# Patient Record
Sex: Female | Born: 2000 | Race: White | Hispanic: No | Marital: Single | Smoking: Never smoker
Health system: Southern US, Community
[De-identification: ages and names within clinical notes are randomized; demographics above are authoritative.]

## PROBLEM LIST (undated history)

## (undated) ENCOUNTER — Ambulatory Visit: Payer: Medicaid Other

## (undated) DIAGNOSIS — B009 Herpesviral infection, unspecified: Secondary | ICD-10-CM

## (undated) DIAGNOSIS — G40909 Epilepsy, unspecified, not intractable, without status epilepticus: Secondary | ICD-10-CM

## (undated) DIAGNOSIS — J45909 Unspecified asthma, uncomplicated: Secondary | ICD-10-CM

## (undated) DIAGNOSIS — F419 Anxiety disorder, unspecified: Secondary | ICD-10-CM

## (undated) DIAGNOSIS — T7840XA Allergy, unspecified, initial encounter: Secondary | ICD-10-CM

## (undated) DIAGNOSIS — R569 Unspecified convulsions: Secondary | ICD-10-CM

## (undated) HISTORY — PX: TYMPANOSTOMY TUBE PLACEMENT: SHX32

## (undated) HISTORY — PX: ADENOIDECTOMY: SUR15

## (undated) HISTORY — PX: FINGER FRACTURE SURGERY: SHX638

## (undated) HISTORY — DX: Epilepsy, unspecified, not intractable, without status epilepticus: G40.909

## (undated) HISTORY — DX: Allergy, unspecified, initial encounter: T78.40XA

## (undated) HISTORY — DX: Anxiety disorder, unspecified: F41.9

## (undated) HISTORY — DX: Herpesviral infection, unspecified: B00.9

## (undated) HISTORY — PX: BRAIN SURGERY: SHX531

---
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
## 2021-12-07 ENCOUNTER — Ambulatory Visit (LOCAL_COMMUNITY_HEALTH_CENTER): Payer: Self-pay | Admitting: Family Medicine

## 2021-12-07 ENCOUNTER — Encounter: Payer: Self-pay | Admitting: Family Medicine

## 2021-12-07 VITALS — BP 128/83 | Ht 65.5 in | Wt 162.4 lb

## 2021-12-07 DIAGNOSIS — Z30013 Encounter for initial prescription of injectable contraceptive: Secondary | ICD-10-CM

## 2021-12-07 DIAGNOSIS — Z8669 Personal history of other diseases of the nervous system and sense organs: Secondary | ICD-10-CM

## 2021-12-07 DIAGNOSIS — Z113 Encounter for screening for infections with a predominantly sexual mode of transmission: Secondary | ICD-10-CM

## 2021-12-07 DIAGNOSIS — Z0000 Encounter for general adult medical examination without abnormal findings: Secondary | ICD-10-CM

## 2021-12-07 DIAGNOSIS — Z3009 Encounter for other general counseling and advice on contraception: Secondary | ICD-10-CM

## 2021-12-07 LAB — HM HIV SCREENING LAB: HM HIV Screening: NEGATIVE

## 2021-12-07 LAB — WET PREP FOR TRICH, YEAST, CLUE
Trichomonas Exam: NEGATIVE
Yeast Exam: NEGATIVE

## 2021-12-07 MED ORDER — MEDROXYPROGESTERONE ACETATE 150 MG/ML IM SUSP
150.0000 mg | INTRAMUSCULAR | Status: AC
  Administered 2021-12-07 – 2022-09-01 (×4): 150 mg via INTRAMUSCULAR

## 2021-12-07 NOTE — Progress Notes (Signed)
[facility] DEPARTMENT ?[facility] ?[address] [employer] ?Main Number: [phone] ? ? ? ?Family Planning Visit- Initial Visit ? ?Subjective:  ?Jill Woodard is a 21 y.o.  G0P0000   being seen today for an initial annual visit and to discuss reproductive life planning.  The patient is currently using Hormonal Injection for pregnancy prevention. Patient reports   does not want a pregnancy in the next year.   ? ? report they are looking for a method that provides High efficacy at preventing pregnancy, Discrete method, Long term method, and Other method that can be used with medical condition.  ? ?Patient has the following medical conditions does not have a problem list on file. ? ?Chief Complaint  ?Patient presents with  ? Annual Exam  ?  PE and Depo  ? ? ?Patient reports here for physical and continue Depo.  Pt moved from [place] and was seen briefly in [place] before moving here.  ? ?Patient denies any other concerns than those listed on intake form.  See intake form.   ? ?Body mass index is 26.61 kg/m?. - Patient is eligible for diabetes screening based on BMI and age >42?  not applicable ?HA1C ordered? not applicable ? ?Patient reports 1  partner/s in last year. Desires STI screening?  Yes ? ?Has patient been screened once for HCV in the past?  No ? No results found for: HCVAB ? ?Does the patient have current drug use (including MJ), have a partner with drug use, and/or has been incarcerated since last result? No  ?If yes-- Screen for HCV through [facility] ?  ?Does the patient meet criteria for HBV testing? No ? ?Criteria:  ?-Household, sexual or needle sharing contact with HBV ?-History of drug use ?-HIV positive ?-Those with known Hep C ? ? ?Health Maintenance Due  ?Topic Date Due  ? HIV Screening  Never done  ? COVID-19 Vaccine (3 - Booster for Pfizer series) 01/10/2020  ? Hepatitis C Screening  Never done  ? ? ?Review of Systems  ?Constitutional:  Negative for chills, fever,  malaise/fatigue and weight loss.  ?HENT:  Negative for congestion, hearing loss and sore throat.   ?Eyes:  Negative for blurred vision, double vision and photophobia.  ?Respiratory:  Negative for shortness of breath.   ?Cardiovascular:  Negative for chest pain.  ?Gastrointestinal:  Negative for abdominal pain, blood in stool, constipation, diarrhea, heartburn, nausea and vomiting.  ?Genitourinary:  Negative for dysuria and frequency.  ?Musculoskeletal:  Negative for back pain, joint pain and neck pain.  ?Skin:  Negative for itching and rash.  ?Neurological:  Positive for dizziness, seizures and headaches. Negative for weakness.  ?Endo/Heme/Allergies:  Does not bruise/bleed easily.  ?Psychiatric/Behavioral:  Negative for depression, substance abuse and suicidal ideas.   ? ?The following portions of the patient's history were reviewed and updated as appropriate: allergies, current medications, past family history, past medical history, past social history, past surgical history and problem list. Problem list updated. ? ? ?See flowsheet for other program required questions. ? ?Objective:  ? ?Vitals:  ? 12/07/21 1022 12/07/21 1156  ?BP:  128/83  ?Weight: 162 lb 6.4 oz (73.7 kg)   ?Height: 5' 5.5" (1.664 m)   ? ? ?Physical Exam ?Vitals and nursing note reviewed.  ?Constitutional:   ?   Appearance: Normal appearance.  ?HENT:  ?   Head: Normocephalic and atraumatic.  ?   Mouth/Throat:  ?   Mouth: Mucous membranes are moist.  ?  Dentition: Abnormal dentition.  ?   Pharynx: No oropharyngeal exudate or posterior oropharyngeal erythema.  ?   Comments: Abnormal tooth decay r/t medication Keppra ?Eyes:  ?   General: No scleral icterus. ?Cardiovascular:  ?   Rate and Rhythm: Normal rate and regular rhythm.  ?   Pulses: Normal pulses.  ?   Heart sounds: Normal heart sounds.  ?Pulmonary:  ?   Effort: Pulmonary effort is normal.  ?   Breath sounds: Normal breath sounds.  ?Chest:  ? ? ?   Comments: Vagal Nerve Stimulator For  Epilepsy  ?Abdominal:  ?   General: Abdomen is flat. Bowel sounds are normal.  ?   Palpations: Abdomen is soft.  ?Musculoskeletal:     ?   General: Normal range of motion.  ?   Cervical back: Normal range of motion and neck supple.  ?Skin: ?   General: Skin is warm and dry.  ?Neurological:  ?   General: No focal deficit present.  ?   Mental Status: She is alert.  ? ? ? ?Assessment and Plan:  ?Jill Woodard is a 21 y.o. female presenting to the [facility] Department for an initial annual wellness/contraceptive visit ? ?Contraception counseling: Reviewed options based on patient desire and reproductive life plan. Patient is interested in Hormonal Injection. This was provided to the patient today.  ? ?Risks, benefits, and typical effectiveness rates were reviewed.  Questions were answered.  Written information was also given to the patient to review.   ? ?The patient will follow up in  13 weeks for surveillance.  The patient was told to call with any further questions, or with any concerns about this method of contraception.  Emphasized use of condoms 100% of the time for STI prevention. ? ?Need for ECP was assessed. Patient reported last sex was 03/2021. ECP not offered.    ? ? ?1. Routine general medical examination at a health care facility ?Well woman exam  ?Discussed when CBE and PAP due  ?Discussed dental care d/t seizure medication effects on teeth. Given information for [facility] dental clinic.  ? ?2. Screening examination for venereal disease ?Patient accepted all screenings including wet prep, oral, vaginal CT/GC and bloodwork for HIV/RPR.  ? ?Wet prep results neg    ?Treatment needed  ?Discussed time line for State Lab results and that patient will be called with positive results and encouraged patient to call if she had not heard in 2 weeks.  ? ?Recommended condom use with all sex ? ? ?- Chlamydia/Gonorrhea [facility] ?- HIV [place] [facility] ?- Syphilis Serology, [place] [facility] ?- WET PREP FOR TRICH,  YEAST, CLUE ? ?3. Encounter for initial prescription of injectable contraceptive ?Pt desires to continue with Depo.  Last Depo was 09/03/2021 @ [facility] in [place], [place].  ?ROI sent for previous records.  ?- medroxyPROGESTERone (DEPO-PROVERA) injection 150 mg ? ?4. History of epilepsy ?Dx at 20-44 years old  ?Last seizure was 5 days ago.   ?Medication managed.  ?Discussed with patient about finding PCP and neurologist in this area. [facility] given.  ?Vagal Nerve Stimulator For Epilepsy palpated  ? ? ?5. Hx of migraines ?Pt has HA with seizures. Lat HA was 5 days ago .    ? ? ? ? ?Return in about 13 weeks (around 03/08/2022) for depo. ? ?No future appointments. ? ?Wendi Snipes, FNP ? ?

## 2021-12-07 NOTE — Progress Notes (Signed)
Pt here for establishing care, DEPO and STD testing. Wet Prep negative. Depo appointment reminder card given. Condoms declined. Lethea Killings RN ?

## 2021-12-10 ENCOUNTER — Ambulatory Visit: Payer: Medicaid - Out of State | Admitting: Nurse Practitioner

## 2022-03-14 ENCOUNTER — Ambulatory Visit (LOCAL_COMMUNITY_HEALTH_CENTER): Payer: Self-pay

## 2022-03-14 VITALS — BP 120/78 | Ht 65.0 in | Wt 162.5 lb

## 2022-03-14 DIAGNOSIS — Z3042 Encounter for surveillance of injectable contraceptive: Secondary | ICD-10-CM

## 2022-03-14 DIAGNOSIS — Z30013 Encounter for initial prescription of injectable contraceptive: Secondary | ICD-10-CM

## 2022-03-14 DIAGNOSIS — Z3009 Encounter for other general counseling and advice on contraception: Secondary | ICD-10-CM

## 2022-03-14 NOTE — Progress Notes (Signed)
Patient seen in nurse clinic.  13 weeks and 6 days post Depo.  Voices no concerns.  Medroxyprogesterone 150 mg administered intramuscularly in right deltoid per order dated 12/07/2021 x 1 year by Elveria Rising, FNP.   Tolerated well. Next injection due 05/30/22 and reminder card given to patient.

## 2022-03-24 ENCOUNTER — Telehealth: Payer: Medicaid - Out of State | Admitting: Physician Assistant

## 2022-03-24 DIAGNOSIS — R61 Generalized hyperhidrosis: Secondary | ICD-10-CM

## 2022-03-24 DIAGNOSIS — J039 Acute tonsillitis, unspecified: Secondary | ICD-10-CM

## 2022-03-24 NOTE — Progress Notes (Signed)
Because of repetitive occurrence of these symptoms over the past few years and associated chills and night sweats, I feel your condition warrants further evaluation and I recommend that you be seen in a face to face visit.   NOTE: There will be NO CHARGE for this eVisit   If you are having a true medical emergency please call 911.      For an urgent face to face visit,  has seven urgent care centers for your convenience:     [facility] Health Urgent Care Center at [facility] Directions [phone] [address] [place], [place] [address]    [facility] Health Urgent Care Center [facility]) Get Driving Directions [phone] [address] [place], [place] [address]  [facility] Health Urgent Care Center [facility]) Get Driving Directions [phone] [address] [place],  [place]  [address]  [facility] Health Urgent Care Center [facility] - at [facility] Directions  [phone] [address] [place],  [place] [address]   [facility] Health Urgent Care at [facility] Get Driving Directions [phone] [address], [address] [place], [place] [address]   [facility] Health Urgent Care at [facility] Get Driving Directions  [phone] [address] [place], [place] [address]   [facility] Health Urgent Care at [facility] Directions [phone] [address] [place], [place] [address]  Your MyChart E-visit questionnaire answers were reviewed by a board certified advanced clinical practitioner to complete your personal care plan based on your specific symptoms.  Thank you for using e-Visits.

## 2022-03-26 ENCOUNTER — Ambulatory Visit
Admission: RE | Admit: 2022-03-26 | Discharge: 2022-03-26 | Disposition: A | Discharge status: Home / Self Care | Payer: Medicaid - Out of State | Source: Ambulatory Visit | Attending: Family Medicine | Admitting: Family Medicine

## 2022-03-26 VITALS — BP 129/95 | HR 97 | Temp 98.4°F | Resp 14 | Ht 65.0 in | Wt 162.0 lb

## 2022-03-26 DIAGNOSIS — J029 Acute pharyngitis, unspecified: Secondary | ICD-10-CM

## 2022-03-26 LAB — GROUP A STREP BY PCR: Group A Strep by PCR: NOT DETECTED

## 2022-03-26 MED ORDER — NYSTATIN 100000 UNIT/ML MT SUSP: 5.0000 mL | Freq: Three times a day (TID) | OROMUCOSAL | 0 refills | Status: DC | PRN

## 2022-03-26 NOTE — Discharge Instructions (Addendum)
I sent by the pharmacy to pick up your prescription.  Be sure to gargle with warm salt water several times a day.  Avoid things that irritate your throat like citrus and soda.  I recommend picking up some Cepacol lozenges or Chloraseptic spray while you are at the pharmacy.

## 2022-03-26 NOTE — ED Provider Notes (Addendum)
[facility]    CSN: [id number] Arrival date & time: 03/26/22  0941      History   Chief Complaint Chief Complaint  Patient presents with   Sore Throat    Appointment    HPI Jill Woodard is a 21 y.o. female.   HPI  Jill Woodard presents for sore throat since Wednesday.  Mom reports that she is having intermittent sore throat for the past 3 years.  She has tested negative for strep several times.  Patient reports it looks like a canker sore that starts off as little red dots and then spreads into the little sores.  Pain started on the right side on Wednesday night and then is now more on the left.  Left side hurts more than the right.  She has been tested for mono which was negative.  He has been eating macaroni and applesauce because it hurts to swallow.  She has been drinking cold drinks and warm drinks as they feel better going down.  Denies fever, abdominal pain, nausea, diarrhea, vomiting, rash, ear pain.  She did experience some chills and headache that are improving.   They have moved from [place]. She has a vagus nerve stimulator due to history of epilepsy.  No recent seizure.     Past Medical History:  Diagnosis Date   Asthma    Seizures [facility])     Patient Active Problem List   Diagnosis Date Noted   Hx of migraines 12/07/2021    Past Surgical History:  Procedure Laterality Date   ADENOIDECTOMY     FINGER FRACTURE SURGERY     TYMPANOSTOMY TUBE PLACEMENT      OB History     Gravida  0   Para  0   Term  0   Preterm  0   AB  0   Living  0      SAB  0   IAB  0   Ectopic  0   Multiple  0   Live Births  0            Home Medications    Prior to Admission medications   Medication Sig Start Date End Date Taking? Authorizing Provider  cloBAZam (ONFI PO) Take 15 mg by mouth 2 (two) times daily.   Yes [provider]  levETIRAcetam (KEPPRA) 500 MG tablet Take 1,000 mg by mouth 2 (two) times daily. 05/17/21  Yes  [provider]  magic mouthwash (nystatin, lidocaine, diphenhydrAMINE, alum & mag hydroxide) suspension Swish and swallow 5 mLs 3 (three) times daily as needed for mouth pain. 03/26/22  Yes Antwoin Lackey, Seward Meth, DO  medroxyPROGESTERone (DEPO-PROVERA) 150 MG/ML injection Inject into the muscle. 02/20/17 08/10/22 Yes [provider]  cephALEXin (KEFLEX) 500 MG capsule Take 1 capsule (500 mg total) by mouth 2 (two) times daily. Patient not taking: Reported on 12/07/2021 02/27/15   Loleta Rose, MD  diazepam (DIASTAT ACUDIAL) 10 MG GEL Place 10 mg rectally once. 02/27/15   Loleta Rose, MD    Family History Family History  Problem Relation Age of Onset   Depression Sister     Social History Social History   Tobacco Use   Smoking status: Never    Passive exposure: Never  Vaping Use   Vaping Use: Never used  Substance Use Topics   Alcohol use: No   Drug use: No     Allergies   Amoxicillin   Review of Systems Review of Systems: :negative unless otherwise stated in  HPI.      Physical Exam Triage Vital Signs ED Triage Vitals  Enc Vitals Group     BP      Pulse      Resp      Temp      Temp src      SpO2      Weight      Height      Head Circumference      Peak Flow      Pain Score      Pain Loc      Pain Edu?      Excl. in GC?    No data found.  Updated Vital Signs BP (!) 129/95 (BP Location: Left Arm)   Pulse 97   Temp 98.4 F (36.9 C) (Oral)   Resp 14   Ht 5\' 5"  (1.651 m)   Wt 73.5 kg   SpO2 100%   BMI 26.96 kg/m   Visual Acuity Right Eye Distance:   Left Eye Distance:   Bilateral Distance:    Right Eye Near:   Left Eye Near:    Bilateral Near:     Physical Exam GEN:     alert, non-toxic appearing female in no distress    HENT:  mucus membranes moist, oropharyngeal erythematous ulcerative lesions, no exudate, 1+ tonsillar hypertrophy, moderate no erythema, bilateral TM normal EYES:   pupils equal and reactive, no scleral  injection NECK:  normal ROM, no lymphadenopathy RESP:  no increased work of breathing  CVS:   regular rate Skin:   warm and dry    UC Treatments / Results  Labs (all labs ordered are listed, but only abnormal results are displayed) Labs Reviewed  GROUP A STREP BY PCR  AEROBIC CULTURE W GRAM STAIN (SUPERFICIAL SPECIMEN)    EKG   Radiology No results found.  Procedures Procedures (including critical care time)  Medications Ordered in UC Medications - No data to display  Initial Impression / Assessment and Plan / UC Course  I have reviewed the triage vital signs and the nursing notes.  Pertinent labs & imaging results that were available during my care of the patient were reviewed by me and considered in my medical decision making (see chart for details).       Pharyngitis Patient is a 21 year old female with intermittent pharyngitis with for the past 3 years.  She presents with acute onset pharyngitis for the past 3 days.  She has known canker sores.  Reports she had a throat culture in the past that showed a yeast infection.  Strep test today was negative.  Throat culture sent to the lab.  She is overall well-appearing, well-hydrated and afebrile.  She has some ulcerative lesions concerning for canker sores on exam.  This may be the cause of her pharyngitis.  Recommended Chloraseptic spray/lozenges and warm salt water gargles.  Advised to avoid acidic foods and things known to make her pharyngitis worse.  Sent prescription for Magic mouthwash to the pharmacy.  Discussed MDM, treatment plan and plan for follow-up with patient/parent who agrees with plan.        Final Clinical Impressions(s) / UC Diagnoses   Final diagnoses:  Pharyngitis, unspecified etiology     Discharge Instructions      I sent by the pharmacy to pick up your prescription.  Be sure to gargle with warm salt water several times a day.  Avoid things that irritate your throat like citrus and soda.   I  recommend picking up some Cepacol lozenges or Chloraseptic spray while you are at the pharmacy.     ED Prescriptions     Medication Sig Dispense Auth. Provider   magic mouthwash (nystatin, lidocaine, diphenhydrAMINE, alum & mag hydroxide) suspension Swish and swallow 5 mLs 3 (three) times daily as needed for mouth pain. 180 mL Katha Cabal, DO      PDMP not reviewed this encounter.   Katha Cabal, DO 03/26/22 1043    Katha Cabal, DO 03/26/22 1045

## 2022-03-26 NOTE — ED Triage Notes (Signed)
Patient c/o sore throat and pain when swallowing that started on Wed.  Patient denies fevers.

## 2022-03-28 LAB — AEROBIC CULTURE W GRAM STAIN (SUPERFICIAL SPECIMEN)

## 2022-05-24 DIAGNOSIS — G43001 Migraine without aura, not intractable, with status migrainosus: Secondary | ICD-10-CM | POA: Diagnosis not present

## 2022-05-24 DIAGNOSIS — R569 Unspecified convulsions: Secondary | ICD-10-CM | POA: Diagnosis not present

## 2022-05-24 DIAGNOSIS — Z79899 Other long term (current) drug therapy: Secondary | ICD-10-CM | POA: Diagnosis not present

## 2022-06-03 DIAGNOSIS — R569 Unspecified convulsions: Secondary | ICD-10-CM | POA: Diagnosis not present

## 2022-06-16 ENCOUNTER — Ambulatory Visit (LOCAL_COMMUNITY_HEALTH_CENTER): Payer: Medicaid Other

## 2022-06-16 VITALS — BP 118/69 | Ht 65.0 in | Wt 166.5 lb

## 2022-06-16 DIAGNOSIS — Z30013 Encounter for initial prescription of injectable contraceptive: Secondary | ICD-10-CM

## 2022-06-16 DIAGNOSIS — Z3042 Encounter for surveillance of injectable contraceptive: Secondary | ICD-10-CM

## 2022-06-16 DIAGNOSIS — Z309 Encounter for contraceptive management, unspecified: Secondary | ICD-10-CM

## 2022-06-16 DIAGNOSIS — Z3009 Encounter for other general counseling and advice on contraception: Secondary | ICD-10-CM

## 2022-06-16 NOTE — Progress Notes (Signed)
13 weeks 3 days post depo. Voices no concerns. Depo given today per order by Hilario Quarry, FNP dated 12/07/2021. Tolerated well L delt. Next depo due 09/01/2022, pt aware. Josie Saunders, RN

## 2022-06-22 DIAGNOSIS — H5213 Myopia, bilateral: Secondary | ICD-10-CM | POA: Diagnosis not present

## 2022-06-27 ENCOUNTER — Telehealth: Payer: Medicaid Other | Admitting: Family Medicine

## 2022-06-27 DIAGNOSIS — J029 Acute pharyngitis, unspecified: Secondary | ICD-10-CM

## 2022-06-27 NOTE — Progress Notes (Signed)
Odin  Never responded back

## 2022-06-29 DIAGNOSIS — R569 Unspecified convulsions: Secondary | ICD-10-CM | POA: Diagnosis not present

## 2022-06-29 DIAGNOSIS — G43001 Migraine without aura, not intractable, with status migrainosus: Secondary | ICD-10-CM | POA: Diagnosis not present

## 2022-06-29 DIAGNOSIS — G479 Sleep disorder, unspecified: Secondary | ICD-10-CM | POA: Diagnosis not present

## 2022-06-29 DIAGNOSIS — Z79899 Other long term (current) drug therapy: Secondary | ICD-10-CM | POA: Diagnosis not present

## 2022-06-29 DIAGNOSIS — F419 Anxiety disorder, unspecified: Secondary | ICD-10-CM | POA: Diagnosis not present

## 2022-07-23 DIAGNOSIS — R569 Unspecified convulsions: Secondary | ICD-10-CM | POA: Diagnosis not present

## 2022-08-19 DIAGNOSIS — R569 Unspecified convulsions: Secondary | ICD-10-CM | POA: Diagnosis not present

## 2022-08-20 DIAGNOSIS — R569 Unspecified convulsions: Secondary | ICD-10-CM | POA: Diagnosis not present

## 2022-08-21 DIAGNOSIS — R569 Unspecified convulsions: Secondary | ICD-10-CM | POA: Diagnosis not present

## 2022-09-01 ENCOUNTER — Ambulatory Visit (LOCAL_COMMUNITY_HEALTH_CENTER): Payer: Medicaid Other

## 2022-09-01 VITALS — BP 118/78 | Ht 65.0 in | Wt 163.0 lb

## 2022-09-01 DIAGNOSIS — Z30013 Encounter for initial prescription of injectable contraceptive: Secondary | ICD-10-CM

## 2022-09-01 DIAGNOSIS — Z3042 Encounter for surveillance of injectable contraceptive: Secondary | ICD-10-CM

## 2022-09-01 DIAGNOSIS — Z309 Encounter for contraceptive management, unspecified: Secondary | ICD-10-CM

## 2022-09-01 DIAGNOSIS — Z3009 Encounter for other general counseling and advice on contraception: Secondary | ICD-10-CM

## 2022-09-01 NOTE — Progress Notes (Signed)
11 weeks 0 days post depo. Voices no concerns. Depo given today per order by Hilario Quarry, FNP dated 12/07/2021. Tolerated well R delt. Next depo due 11/17/2022 and annual PE due 12/09/2022. Has reminder. Josie Saunders, RN

## 2022-09-21 DIAGNOSIS — Z79899 Other long term (current) drug therapy: Secondary | ICD-10-CM | POA: Diagnosis not present

## 2022-09-21 DIAGNOSIS — G479 Sleep disorder, unspecified: Secondary | ICD-10-CM | POA: Diagnosis not present

## 2022-09-21 DIAGNOSIS — R569 Unspecified convulsions: Secondary | ICD-10-CM | POA: Diagnosis not present

## 2022-09-21 DIAGNOSIS — G43001 Migraine without aura, not intractable, with status migrainosus: Secondary | ICD-10-CM | POA: Diagnosis not present

## 2022-09-21 DIAGNOSIS — F419 Anxiety disorder, unspecified: Secondary | ICD-10-CM | POA: Diagnosis not present

## 2022-10-19 DIAGNOSIS — R569 Unspecified convulsions: Secondary | ICD-10-CM | POA: Insufficient documentation

## 2022-10-24 ENCOUNTER — Telehealth: Payer: Medicaid Other | Admitting: Nurse Practitioner

## 2022-10-24 DIAGNOSIS — J020 Streptococcal pharyngitis: Secondary | ICD-10-CM

## 2022-10-24 MED ORDER — AZITHROMYCIN 250 MG PO TABS: ORAL_TABLET | ORAL | 0 refills | Status: AC

## 2022-10-24 NOTE — Progress Notes (Signed)
E-Visit for Sore Throat - Strep Symptoms  We are sorry that you are not feeling well.  Here is how we plan to help!  Based on what you have shared with me it is likely that you have strep pharyngitis.  Strep pharyngitis is inflammation and infection in the back of the throat.  This is an infection cause by bacteria and is treated with antibiotics.  I have prescribed Azithromycin 250 mg two tablets today and then one daily for 4 additional days. For throat pain, we recommend over the counter oral pain relief medications such as acetaminophen or aspirin, or anti-inflammatory medications such as ibuprofen or naproxen sodium. Topical treatments such as oral throat lozenges or sprays may be used as needed. Strep infections are not as easily transmitted as other respiratory infections, however we still recommend that you avoid close contact with loved ones, especially the very young and elderly.  Remember to wash your hands thoroughly throughout the day as this is the number one way to prevent the spread of infection and wipe down door knobs and counters with disinfectant.   Home Care: Only take medications as instructed by your medical team. Complete the entire course of an antibiotic. Do not take these medications with alcohol. A steam or ultrasonic humidifier can help congestion.  You can place a towel over your head and breathe in the steam from hot water coming from a faucet. Avoid close contacts especially the very young and the elderly. Cover your mouth when you cough or sneeze. Always remember to wash your hands.  Get Help Right Away If: You develop worsening fever or sinus pain. You develop a severe head ache or visual changes. Your symptoms persist after you have completed your treatment plan.  Make sure you Understand these instructions. Will watch your condition. Will get help right away if you are not doing well or get worse.   Thank you for choosing an e-visit.  Your e-visit  answers were reviewed by a board certified advanced clinical practitioner to complete your personal care plan. Depending upon the condition, your plan could have included both over the counter or prescription medications.  Please review your pharmacy choice. Make sure the pharmacy is open so you can pick up prescription now. If there is a problem, you may contact your provider through MyChart messaging and have the prescription routed to another pharmacy.  Your safety is important to us. If you have drug allergies check your prescription carefully.   For the next 24 hours you can use MyChart to ask questions about today's visit, request a non-urgent call back, or ask for a work or school excuse. You will get an email in the next two days asking about your experience. I hope that your e-visit has been valuable and will speed your recovery.   Meds ordered this encounter  Medications   azithromycin (ZITHROMAX) 250 MG tablet    Sig: Take 2 tablets on day 1, then 1 tablet daily on days 2 through 5    Dispense:  6 tablet    Refill:  0     I spent approximately 5 minutes reviewing the patient's history, current symptoms and coordinating their care today.   

## 2022-10-26 ENCOUNTER — Encounter: Payer: Self-pay | Admitting: Emergency Medicine

## 2022-10-26 ENCOUNTER — Ambulatory Visit
Admission: EM | Admit: 2022-10-26 | Discharge: 2022-10-26 | Disposition: A | Discharge status: Home / Self Care | Payer: Medicaid Other

## 2022-10-26 DIAGNOSIS — K120 Recurrent oral aphthae: Secondary | ICD-10-CM | POA: Diagnosis not present

## 2022-10-26 NOTE — ED Triage Notes (Signed)
Pt presents with a sore in the back of her throat x 3 days.

## 2022-10-26 NOTE — Discharge Instructions (Signed)
Use the magic mouthwash as needed for pain.  Avoid acidic or hard crunchy foods.  You can also use OTC Chloraseptic or Sucrets lozenges to help with pain.  An amino acid called L-Lysine can be helpful at preventing these from occurring. Take 09-2998 mg 3x a day.

## 2022-10-26 NOTE — ED Provider Notes (Signed)
[facility]    CSN: [id number] Arrival date & time: 10/26/22  0801      History   Chief Complaint Chief Complaint  Patient presents with   Sore    HPI Jill Woodard is a 22 y.o. female.   HPI  22 year old female here for evaluation of sores in throat.  The patient has a past medical history that is significant for asthma and seizures presenting for evaluation of 3 days worth of sores in the back of her throat.  She states that she gets these every few months and they are not associate with fever, runny nose, nasal congestion, or ear pain.  Past Medical History:  Diagnosis Date   Asthma    Seizures 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 Authorizing Provider  clonazePAM (KLONOPIN) 0.5 MG tablet Take by mouth. 10/19/22 10/19/23 Yes [provider]  divalproex (DEPAKOTE ER) 500 MG 24 hr tablet TAKE TWO TABLETS TWICE DAILY 04/29/19  Yes [provider]  lamoTRIgine (LAMICTAL) 100 MG tablet Take by mouth. 10/19/22 10/19/23 Yes [provider]  levETIRAcetam (KEPPRA) 500 MG tablet Take 1,000 mg by mouth 2 (two) times daily. 05/17/21  Yes [provider]  medroxyPROGESTERone (DEPO-PROVERA) 150 MG/ML injection Inject into the muscle. 02/20/17 10/26/22 Yes [provider]  NAYZILAM 5 MG/0.1ML SOLN  06/14/22   Yes [provider]  azithromycin (ZITHROMAX) 250 MG tablet Take 2 tablets on day 1, then 1 tablet daily on days 2 through 5 10/24/22 10/29/22  Apolonio Schneiders, FNP  busPIRone (BUSPAR) 15 MG tablet Take 15 mg by mouth 2 (two) times daily.    [provider]  cephALEXin (KEFLEX) 500 MG capsule Take 1 capsule (500 mg total) by mouth 2 (two) times daily. Patient not taking: Reported on 12/07/2021 02/27/15   Hinda Kehr, MD  cloBAZam (ONFI PO) Take 15 mg by mouth 2 (two) times daily.    [provider]  diazepam (DIASTAT ACUDIAL) 10 MG GEL Place 10 mg rectally once. Patient not taking: Reported on 09/01/2022 02/27/15   Hinda Kehr, MD  magic mouthwash (nystatin, lidocaine, diphenhydrAMINE, alum & mag hydroxide) suspension Swish and swallow 5 mLs 3 (three) times daily as needed for mouth pain. 03/26/22   Lyndee Hensen, DO    Family History Family History  Problem Relation Age of Onset   Depression Sister     Social History Social History   Tobacco Use   Smoking status: Never    Passive exposure: Never  Vaping Use   Vaping Use: Never used  Substance Use  Topics   Alcohol use: No   Drug use: No     Allergies   Dust mite extract, Other, Grapefruit extract, and Amoxicillin   Review of Systems Review of Systems  Constitutional:  Negative for fever.  HENT:  Positive for sore throat. Negative for congestion, ear pain and rhinorrhea.        Sores in throat.     Physical Exam Triage Vital Signs ED Triage Vitals [10/26/22 0810]  Enc Vitals Group     BP      Pulse      Resp      Temp      Temp src      SpO2      Weight      Height      Head Circumference      Peak Flow      Pain Score 8     Pain Loc      Pain Edu?      Excl. in [place]?    No data found.  Updated Vital Signs BP 118/76 (BP Location: Right Arm)   Pulse 94   Temp 98.2 F (36.8 C) (Oral)   Resp 18   SpO2 97%   Visual Acuity Right Eye Distance:   Left Eye Distance:   Bilateral  Distance:    Right Eye Near:   Left Eye Near:    Bilateral Near:     Physical Exam Vitals and nursing note reviewed.  Constitutional:      Appearance: Normal appearance. She is not ill-appearing.  HENT:     Head: Normocephalic and atraumatic.     Mouth/Throat:     Mouth: Mucous membranes are moist.     Pharynx: Oropharynx is clear. Posterior oropharyngeal erythema present. No oropharyngeal exudate.     Comments: The patient's soft palate is erythematous and there is a single aphthous ulcer present on the left-hand side. Musculoskeletal:     Cervical back: Normal range of motion and neck supple.  Lymphadenopathy:     Cervical: No cervical adenopathy.  Neurological:     Mental Status: She is alert.      UC Treatments / Results  Labs (all labs ordered are listed, but only abnormal results are displayed) Labs Reviewed - No data to display  EKG   Radiology No results found.  Procedures Procedures (including critical care time)  Medications Ordered in UC Medications - No data to display  Initial Impression / Assessment and Plan / UC Course  I have reviewed the triage vital signs and the nursing notes.  Pertinent labs & imaging results that were available during my care of the patient were reviewed by me and considered in my medical decision making (see chart for details).   Patient is a very pleasant, nontoxic-appearing 22 year old female presenting for evaluation of 3 days worth of a sore in her throat that she states comes every few months.  She was last seen here in August 2023 and had a rapid strep collected at that time which was negative as well as an aerobic throat culture which showed abundant strep G.  On exam today she has a single white ulcer on her soft palate on the left-hand side.  As you can see image above there is an aphthous ulcer present.  I will treat her for aphthous ulcers with Magic mouthwash, which she states she still has from her last visit,  over-the-counter Chloraseptic or Sucrets lozenges, and I have advised her to avoid sharp  or acidic foods for the time being as this could make her symptoms worse.  We also discussed using the amino acid L-lysine and taking 2000 to 3000 mg 3 times a day as a preventative.  Work note provided.  Final Clinical Impressions(s) / UC Diagnoses   Final diagnoses:  Oral aphthous ulcer     Discharge Instructions      Use the magic mouthwash as needed for pain.  Avoid acidic or hard crunchy foods.  You can also use OTC Chloraseptic or Sucrets lozenges to help with pain.  An amino acid called L-Lysine can be helpful at preventing these from occurring. Take 09-2998 mg 3x a day.     ED Prescriptions   None    PDMP not reviewed this encounter.   Margarette Canada, NP 10/26/22 [phone]

## 2022-11-02 ENCOUNTER — Telehealth: Payer: Medicaid Other | Admitting: Physician Assistant

## 2022-11-02 DIAGNOSIS — R3989 Other symptoms and signs involving the genitourinary system: Secondary | ICD-10-CM

## 2022-11-02 NOTE — Progress Notes (Signed)
Because you have never had genital lesions (sores), I feel your condition warrants further evaluation and I recommend that you be seen in a face to face visit.I do apologize since you were just seen in person for a separate issue, but being that it is a first occurrence we like to have this evaluated in person and have testing collected to confirm a diagnosis.    NOTE: There will be NO CHARGE for this eVisit   If you are having a true medical emergency please call 911.      For an urgent face to face visit, [facility] has eight urgent care centers for your convenience:   NEW!! [place] [facility] [place] Get Driving Directions [phone] [address] [place], [place] [facility] [place] Get Driving Directions [id number] [place], [place] [address]   [place] [facility]) Get Driving Directions [id number] [address], [place] [address]  [place] [facility] [place] ([place]) Get Driving Directions [id number] [address] [place],  [place]  [address]  [place] [facility] [place] [facility] at [address] Get Driving Directions  [phone] [place],  [place] [address]   [place] [facility] at [facility] Get Driving Directions [id number] [place], [place], [place] [address]   [place] [facility] at [facility] Get Driving Directions  [id number] [address] [place], [place] [address]   [place] [facility] at [place] Get Driving Directions [id number] [address], [place], [place] [address]  Your MyChart E-visit questionnaire answers were reviewed by a board certified advanced clinical practitioner to complete your personal care plan based on your specific symptoms.  Thank you for using  e-Visits.   I have spent 5 minutes in review of e-visit questionnaire, review and updating patient chart, medical decision making and response to patient.   Mar Daring, PA-C

## 2022-11-04 ENCOUNTER — Ambulatory Visit
Admission: RE | Admit: 2022-11-04 | Discharge: 2022-11-04 | Disposition: A | Discharge status: Home / Self Care | Payer: Medicaid Other | Source: Ambulatory Visit | Attending: Nurse Practitioner | Admitting: Nurse Practitioner

## 2022-11-04 VITALS — BP 129/84 | HR 85 | Temp 97.9°F | Resp 16 | Ht 67.0 in | Wt 170.0 lb

## 2022-11-04 DIAGNOSIS — N949 Unspecified condition associated with female genital organs and menstrual cycle: Secondary | ICD-10-CM | POA: Diagnosis not present

## 2022-11-04 MED ORDER — VALACYCLOVIR HCL 1 G PO TABS: 1000.0000 mg | ORAL_TABLET | Freq: Two times a day (BID) | ORAL | 0 refills | Status: AC

## 2022-11-04 NOTE — ED Provider Notes (Signed)
[facility]    CSN: [id number] Arrival date & time: 11/04/22  1802      History   Chief Complaint Chief Complaint  Patient presents with   Vaginal Problem    Appt    HPI Jill Woodard is a 22 y.o. female Libby Maw for evaluation of genital lesions.  Patient reports 1 week of painful clusters of genital sores.  Denies any vaginal discharge, dysuria.  Denies any STD exposure or history of HSV.  She was seen last week in urgent care for oral aphathous ulcer treatment and is concerned this may be the same thing. She has used vagisil and miconazole otc without improvement.   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 Authorizing Provider  busPIRone (BUSPAR) 15 MG tablet Take 15 mg by mouth 2 (two) times daily.   Yes [provider]  cloBAZam (ONFI PO) Take 15 mg by mouth 2 (two) times daily.   Yes [provider]  clonazePAM (KLONOPIN) 0.5 MG tablet Take by mouth. 10/19/22 10/19/23 Yes [provider]  diazepam (DIASTAT ACUDIAL) 10 MG GEL Place 10 mg rectally once. 02/27/15  Yes Hinda Kehr, MD  divalproex (DEPAKOTE ER) 500 MG 24 hr tablet TAKE TWO TABLETS TWICE DAILY 04/29/19  Yes [provider]  lamoTRIgine (LAMICTAL) 100 MG tablet Take by mouth.  10/19/22 10/19/23 Yes [provider]  levETIRAcetam (KEPPRA) 500 MG tablet Take 1,000 mg by mouth 2 (two) times daily. 05/17/21  Yes [provider]  magic mouthwash (nystatin, lidocaine, diphenhydrAMINE, alum & mag hydroxide) suspension Swish and swallow 5 mLs 3 (three) times daily as needed for mouth pain. 03/26/22  Yes Brimage, Ronnette Juniper, DO  NAYZILAM 5 MG/0.1ML SOLN  06/14/22  Yes [provider]  valACYclovir (VALTREX) 1000 MG tablet Take 1 tablet (1,000 mg total) by mouth 2 (two) times daily for 7 days. 11/04/22 11/11/22 Yes Melynda Ripple, NP  medroxyPROGESTERone (DEPO-PROVERA) 150 MG/ML injection Inject into the muscle. 02/20/17 10/26/22  [provider]    Family History Family History  Problem Relation Age of Onset   Depression Sister     Social History Social History   Tobacco Use   Smoking status: Never    Passive exposure: Never  Vaping Use   Vaping Use: Never used  Substance Use Topics   Alcohol use: No   Drug use: No     Allergies   Dust mite extract, Other, Grapefruit extract, and Amoxicillin   Review of  Systems Review of Systems  Genitourinary:  Positive for vaginal pain.     Physical Exam Triage Vital Signs ED Triage Vitals  Enc Vitals Group     BP 11/04/22 1811 129/84     Pulse Rate 11/04/22 1811 85     Resp 11/04/22 1811 16     Temp 11/04/22 1811 97.9 F (36.6 C)     Temp Source 11/04/22 1811 Oral     SpO2 11/04/22 1811 98 %     Weight 11/04/22 1810 170 lb (77.1 kg)     Height 11/04/22 1810 5\' 7"  (1.702 m)     Head Circumference --      Peak Flow --      Pain Score 11/04/22 1807 7     Pain Loc --      Pain Edu? --      Excl. in [place]? --    No data found.  Updated Vital Signs BP 129/84 (BP Location: Right Arm)   Pulse 85   Temp 97.9 F (36.6 C) (Oral)   Resp 16   Ht 5\' 7"  (1.702 m)   Wt 170 lb (77.1 kg)   SpO2 98%   BMI 26.63 kg/m   Visual Acuity Right Eye Distance:   Left Eye Distance:   Bilateral  Distance:    Right Eye Near:   Left Eye Near:    Bilateral Near:     Physical Exam Vitals and nursing note reviewed. Exam conducted with a chaperone present Juliette Mangle).  Constitutional:      Appearance: Normal appearance.  HENT:     Head: Normocephalic and atraumatic.  Eyes:     Pupils: Pupils are equal, round, and reactive to light.  Cardiovascular:     Rate and Rhythm: Normal rate.  Pulmonary:     Effort: Pulmonary effort is normal.  Genitourinary:    Labia:        Right: Lesion present. No rash.        Comments: 2 clusters of vesicular lesions on the right labia. Skin:    General: Skin is warm and dry.  Neurological:     General: No focal deficit present.     Mental Status: She is alert and oriented to person, place, and time.  Psychiatric:        Mood and Affect: Mood normal.        Behavior: Behavior normal.      UC Treatments / Results  Labs (all labs ordered are listed, but only abnormal results are displayed) Labs Reviewed  HSV CULTURE AND TYPING    EKG   Radiology No results found.  Procedures Procedures (including critical care time)  Medications Ordered in UC Medications - No data to display  Initial Impression / Assessment and Plan / UC Course  I have reviewed the triage vital signs and the nursing notes.  Pertinent labs & imaging results that were available during my care of the patient were reviewed by me and considered in my medical decision making (see chart for details).     Reviewed exam and symptoms with patient.  Discussed exam consistent with genital herpes Start Valtrex twice daily for 7 days Advise she follow-up with her GYN or PCP if her symptoms do not improve and for further treatment for future outbreaks ER precautions reviewed and patient verbalized understanding Final Clinical Impressions(s) / UC Diagnoses   Final diagnoses:  Genital lesion, female     Discharge Instructions      Start Valtrex  twice daily for  7 days Please follow-up with your gynecologist or PCP if your symptoms or not improving Please go to the ER for any worsening symptoms   ED Prescriptions     Medication Sig Dispense Auth. Provider   valACYclovir (VALTREX) 1000 MG tablet Take 1 tablet (1,000 mg total) by mouth 2 (two) times daily for 7 days. 14 tablet Melynda Ripple, NP      PDMP not reviewed this encounter.   Melynda Ripple, NP 11/04/22 [phone]

## 2022-11-04 NOTE — ED Triage Notes (Signed)
Pt c/o vaginal sores around vagina x1 wk states they look like canker sores. Was seen on 3/13 for sores in mouth. Pain worse when urinating has used vagisil & miconazole w/o relief.

## 2022-11-04 NOTE — Discharge Instructions (Addendum)
Start Valtrex twice daily for 7 days Please follow-up with your gynecologist or PCP if your symptoms or not improving Please go to the ER for any worsening symptoms

## 2022-11-06 LAB — HSV CULTURE AND TYPING

## 2022-11-24 ENCOUNTER — Ambulatory Visit: Payer: Medicaid Other

## 2022-11-25 ENCOUNTER — Ambulatory Visit: Payer: Medicaid Other

## 2022-11-25 VITALS — BP 108/76 | Ht 65.0 in | Wt 173.0 lb

## 2022-11-25 DIAGNOSIS — Z3042 Encounter for surveillance of injectable contraceptive: Secondary | ICD-10-CM

## 2022-11-25 DIAGNOSIS — Z30013 Encounter for initial prescription of injectable contraceptive: Secondary | ICD-10-CM

## 2022-11-25 DIAGNOSIS — Z309 Encounter for contraceptive management, unspecified: Secondary | ICD-10-CM | POA: Diagnosis not present

## 2022-11-25 DIAGNOSIS — Z3009 Encounter for other general counseling and advice on contraception: Secondary | ICD-10-CM

## 2022-11-25 MED ORDER — MEDROXYPROGESTERONE ACETATE 150 MG/ML IM SUSP
150.0000 mg | Freq: Once | INTRAMUSCULAR | Status: AC
  Administered 2022-11-25: 150 mg via INTRAMUSCULAR

## 2022-11-25 NOTE — Progress Notes (Signed)
12 weeks 1 day post depo. Voices no concerns. Depo given today per SO Dr Ralene Bathe. Tolerated well L delt. Annual PE due 12/09/2022 and next depo due 02/10/2023. Pt plans to have annual when next depo is due. Has reminder. Jerel Shepherd, RN 3

## 2022-12-12 ENCOUNTER — Encounter: Payer: Self-pay | Admitting: Family Medicine

## 2022-12-19 DIAGNOSIS — G40909 Epilepsy, unspecified, not intractable, without status epilepticus: Secondary | ICD-10-CM | POA: Diagnosis not present

## 2022-12-19 DIAGNOSIS — Z01818 Encounter for other preprocedural examination: Secondary | ICD-10-CM | POA: Diagnosis not present

## 2022-12-19 DIAGNOSIS — R569 Unspecified convulsions: Secondary | ICD-10-CM | POA: Diagnosis not present

## 2022-12-29 ENCOUNTER — Encounter: Payer: Self-pay | Admitting: Family Medicine

## 2022-12-29 ENCOUNTER — Ambulatory Visit (INDEPENDENT_AMBULATORY_CARE_PROVIDER_SITE_OTHER): Payer: Medicaid Other | Admitting: Family Medicine

## 2022-12-29 VITALS — BP 120/68 | HR 106 | Ht 67.0 in | Wt 173.0 lb

## 2022-12-29 DIAGNOSIS — G40909 Epilepsy, unspecified, not intractable, without status epilepticus: Secondary | ICD-10-CM

## 2022-12-29 DIAGNOSIS — F418 Other specified anxiety disorders: Secondary | ICD-10-CM | POA: Diagnosis not present

## 2022-12-29 DIAGNOSIS — M778 Other enthesopathies, not elsewhere classified: Secondary | ICD-10-CM | POA: Diagnosis not present

## 2022-12-29 DIAGNOSIS — R569 Unspecified convulsions: Secondary | ICD-10-CM

## 2023-01-03 ENCOUNTER — Encounter: Payer: Self-pay | Admitting: Family Medicine

## 2023-01-03 DIAGNOSIS — M778 Other enthesopathies, not elsewhere classified: Secondary | ICD-10-CM | POA: Insufficient documentation

## 2023-01-03 DIAGNOSIS — F418 Other specified anxiety disorders: Secondary | ICD-10-CM | POA: Insufficient documentation

## 2023-01-03 HISTORY — DX: Other specified anxiety disorders: F41.8

## 2023-01-03 NOTE — Assessment & Plan Note (Signed)
See additional assessment(s) for plan details. 

## 2023-01-03 NOTE — Progress Notes (Signed)
     Primary Care / Sports Medicine Office Visit  Patient Information:  Patient ID: Jill Woodard, female DOB: [birth year] Age: 22 y.o. MRN: [id number]   Jill Woodard is a pleasant 22 y.o. female presenting with the following:  Chief Complaint  Patient presents with   Establish Care   Wrist Pain    Right wrist, sharp up arm    Vitals:   12/29/22 0830  BP: 120/68  Pulse: (!) 106  SpO2: 97%   Vitals:   12/29/22 0830  Weight: 173 lb (78.5 kg)  Height: 5\' 7"  (1.702 m)   Body mass index is 27.1 kg/m.  No results found.   Independent interpretation of notes and tests performed by another provider:   None  Procedures performed:   None  Pertinent History, Exam, Impression, and Recommendations:   Jill Woodard was seen today for establish care and wrist pain.  Depression with anxiety Assessment & Plan: Reviewed PHQ and GAD scores, management strategies. - Referral to psychiatry for further evaluation and treatment  Orders: -     Ambulatory referral to Psychiatry  Right wrist tendinitis Assessment & Plan: - PRN NSAIDs - Home rehab - Follow-up PRN   Focal seizures (HCC) Assessment & Plan: See additional assessment(s) for plan details.   Seizure disorder [facility]) Assessment & Plan: Follows with neurology.      Orders & Medications No orders of the defined types were placed in this encounter.  Orders Placed This Encounter  Procedures   Ambulatory referral to Psychiatry     No follow-ups on file.     Jerrol Banana, MD, [facility]   Primary Care Sports Medicine Primary Care and Sports Medicine at [facility]

## 2023-01-03 NOTE — Assessment & Plan Note (Signed)
-   PRN NSAIDs - Home rehab - Follow-up PRN

## 2023-01-03 NOTE — Assessment & Plan Note (Signed)
Follows with neurology 

## 2023-01-03 NOTE — Assessment & Plan Note (Signed)
Reviewed PHQ and GAD scores, management strategies. - Referral to psychiatry for further evaluation and treatment

## 2023-01-10 ENCOUNTER — Encounter: Payer: Self-pay | Admitting: Family Medicine

## 2023-01-10 NOTE — Telephone Encounter (Signed)
Please advise 

## 2023-02-01 ENCOUNTER — Encounter: Payer: Self-pay | Admitting: Family Medicine

## 2023-02-02 ENCOUNTER — Other Ambulatory Visit: Payer: Self-pay

## 2023-02-02 DIAGNOSIS — R200 Anesthesia of skin: Secondary | ICD-10-CM

## 2023-02-02 DIAGNOSIS — G8929 Other chronic pain: Secondary | ICD-10-CM

## 2023-02-02 DIAGNOSIS — M778 Other enthesopathies, not elsewhere classified: Secondary | ICD-10-CM

## 2023-02-02 NOTE — Telephone Encounter (Signed)
Order placed

## 2023-02-02 NOTE — Telephone Encounter (Signed)
Please advise 

## 2023-02-08 ENCOUNTER — Ambulatory Visit: Payer: Medicaid Other | Admitting: Family Medicine

## 2023-02-09 ENCOUNTER — Ambulatory Visit: Payer: Medicaid Other | Admitting: Family Medicine

## 2023-02-13 ENCOUNTER — Encounter: Payer: Self-pay | Admitting: Family Medicine

## 2023-02-13 ENCOUNTER — Ambulatory Visit: Payer: Medicaid Other | Admitting: Family Medicine

## 2023-02-13 VITALS — BP 130/88 | HR 113 | Ht 65.0 in | Wt 169.8 lb

## 2023-02-13 DIAGNOSIS — Z30013 Encounter for initial prescription of injectable contraceptive: Secondary | ICD-10-CM

## 2023-02-13 DIAGNOSIS — Z01419 Encounter for gynecological examination (general) (routine) without abnormal findings: Secondary | ICD-10-CM

## 2023-02-13 DIAGNOSIS — Z309 Encounter for contraceptive management, unspecified: Secondary | ICD-10-CM | POA: Diagnosis not present

## 2023-02-13 DIAGNOSIS — Z113 Encounter for screening for infections with a predominantly sexual mode of transmission: Secondary | ICD-10-CM

## 2023-02-13 DIAGNOSIS — Z3009 Encounter for other general counseling and advice on contraception: Secondary | ICD-10-CM

## 2023-02-13 LAB — WET PREP FOR TRICH, YEAST, CLUE
Trichomonas Exam: NEGATIVE
Yeast Exam: NEGATIVE

## 2023-02-13 LAB — HM HIV SCREENING LAB: HM HIV Screening: NEGATIVE

## 2023-02-13 MED ORDER — MEDROXYPROGESTERONE ACETATE 150 MG/ML IM SUSP
150.0000 mg | INTRAMUSCULAR | Status: AC
  Administered 2023-02-13 – 2024-01-12 (×5): 150 mg via INTRAMUSCULAR

## 2023-02-13 NOTE — Progress Notes (Signed)
[facility] DEPARTMENT [facility] [address] Main Number: [phone]  Family Planning Visit- Repeat Yearly Visit  Subjective:  Jill Woodard is a 22 y.o. G0P0000  being seen today for an annual wellness visit and to discuss contraception options.   The patient is currently using Hormonal Injection for pregnancy prevention. Patient does not want a pregnancy in the next year.    report they are looking for a method that provides High efficacy at preventing pregnancy   Patient has the following medical problems: has Hx of migraines; Focal seizures (HCC); Localization-related focal epilepsy with complex partial seizures (HCC); Seizure disorder (HCC); Depression with anxiety; and Right wrist tendinitis on their problem list.  Chief Complaint  Patient presents with   Annual Exam    Depo    Patient reports to clinic for PE and STI testing.   Patient denies concerns about self- states that she has a hx of seizures and weight gain due to her medications. Reports occasional headaches relieved by acupressure    See flowsheet for other program required questions.   Body mass index is 28.26 kg/m. - Patient is eligible for diabetes screening based on BMI> 25 and age >35?  no HA1C ordered? not applicable  Patient reports 1 of partners in last year. Desires STI screening?  Yes   Has patient been screened once for HCV in the past?  No  No results found for: "HCVAB"  Does the patient have current of drug use, have a partner with drug use, and/or has been incarcerated since last result? No  If yes-- Screen for HCV through [facility]   Does the patient meet criteria for HBV testing? No  Criteria:  -Household, sexual or needle sharing contact with HBV -History of drug use -HIV positive -Those with known Hep C   Health Maintenance Due  Topic Date Due   CHLAMYDIA SCREENING  Never done   COVID-19 Vaccine (3 - Pfizer risk series)  12/13/2019   Hepatitis C Screening  Never done    Review of Systems  Constitutional:  Negative for weight loss.  Eyes:  Negative for blurred vision.  Respiratory:  Negative for cough and shortness of breath.   Cardiovascular:  Negative for claudication.  Gastrointestinal:  Negative for nausea.  Genitourinary:  Negative for dysuria and frequency.  Skin:  Negative for rash.  Neurological:  Positive for seizures and headaches.  Endo/Heme/Allergies:  Does not bruise/bleed easily.    The following portions of the patient's history were reviewed and updated as appropriate: allergies, current medications, past family history, past medical history, past social history, past surgical history and problem list. Problem list updated.  Objective:   Vitals:   02/13/23 1015  BP: 130/88  Pulse: (!) 113  Weight: 169 lb 12.8 oz (77 kg)  Height: 5\' 5"  (1.651 m)    Physical Exam Vitals and nursing note reviewed.  Constitutional:      Appearance: Normal appearance.  HENT:     Head: Normocephalic and atraumatic.     Mouth/Throat:     Mouth: Mucous membranes are moist.     Pharynx: Oropharynx is clear. No oropharyngeal exudate or posterior oropharyngeal erythema.  Pulmonary:     Effort: Pulmonary effort is normal.  Abdominal:     General: Abdomen is flat.     Palpations: There is no mass.     Tenderness: There is no abdominal tenderness. There is no rebound.  Genitourinary:    General: Normal  vulva.     Exam position: Lithotomy position.     Pubic Area: No rash or pubic lice.      Labia:        Right: No rash or lesion.        Left: No rash or lesion.      Vagina: Vaginal discharge present. No erythema, bleeding or lesions.     Cervix: Discharge present. No cervical motion tenderness, friability, lesion or erythema.     Uterus: Normal.      Adnexa: Right adnexa normal and left adnexa normal.     Rectum: Normal.     Comments: pH = 5-6  White discharge with odor present   Lymphadenopathy:     Head:     Right side of head: No preauricular or posterior auricular adenopathy.     Left side of head: No preauricular or posterior auricular adenopathy.     Cervical: No cervical adenopathy.     Upper Body:     Right upper body: No supraclavicular, axillary or epitrochlear adenopathy.     Left upper body: No supraclavicular, axillary or epitrochlear adenopathy.     Lower Body: No right inguinal adenopathy. No left inguinal adenopathy.  Skin:    General: Skin is warm and dry.     Findings: No rash.  Neurological:     Mental Status: She is alert and oriented to person, place, and time.       Assessment and Plan:  Jill Woodard is a 22 y.o. female G0P0000 presenting to the [facility] Department for an yearly wellness and contraception visit  1. Well woman exam with routine gynecological exam -Pap due when 22 -CBE due at 25 per ACOG guidelines -no concerns about self   2. Screening for venereal disease  - Chlamydia/Gonorrhea [facility] - HIV [facility] - Syphilis Serology, [place] - WET PREP FOR TRICH, YEAST, CLUE  3. Family planning Contraception counseling: Reviewed options based on patient desire and reproductive life plan. Patient is interested in Hormonal Injection. This was provided to the patient today.   Risks, benefits, and typical effectiveness rates were reviewed.  Questions were answered.  Written information was also given to the patient to review.    The patient will follow up in  3 months for surveillance.  The patient was told to call with any further questions, or with any concerns about this method of contraception.  Emphasized use of condoms 100% of the time for STI prevention.  Educated on ECP and assessed need for ECP. Not indicated- covered under depo window  - medroxyPROGESTERone (DEPO-PROVERA) injection 150 mg    No follow-ups on file.  Future Appointments  Date Time Provider Department Center   02/14/2023 10:40 AM Jerrol Banana, MD [facility]  02/23/2023  8:20 AM Jerrol Banana, MD [facility]    Lenice Llamas, FNP

## 2023-02-13 NOTE — Progress Notes (Signed)
Pt here for annual physical examination and Depo Provera injection.  Wet mount results reviewed with patient.  No treatment needed at this time as per standing orders.  Depo Provera 150mg  IM given in [facility] without complications.  Family planning packet provided.  Condoms declined. Reminder card given for next Depo injection.-Havyn Ramo, RN

## 2023-02-14 ENCOUNTER — Ambulatory Visit
Admission: RE | Admit: 2023-02-14 | Discharge: 2023-02-14 | Disposition: A | Discharge status: Home / Self Care | Payer: Medicaid Other | Source: Ambulatory Visit | Attending: Family Medicine | Admitting: Family Medicine

## 2023-02-14 ENCOUNTER — Encounter: Payer: Self-pay | Admitting: Family Medicine

## 2023-02-14 ENCOUNTER — Ambulatory Visit
Admission: RE | Admit: 2023-02-14 | Discharge: 2023-02-14 | Disposition: A | Discharge status: Home / Self Care | Payer: Medicaid Other | Attending: Family Medicine | Admitting: Family Medicine

## 2023-02-14 ENCOUNTER — Ambulatory Visit (INDEPENDENT_AMBULATORY_CARE_PROVIDER_SITE_OTHER): Payer: Medicaid Other | Admitting: Family Medicine

## 2023-02-14 VITALS — BP 130/80 | HR 96 | Ht 65.0 in | Wt 170.0 lb

## 2023-02-14 DIAGNOSIS — R202 Paresthesia of skin: Secondary | ICD-10-CM | POA: Diagnosis not present

## 2023-02-14 DIAGNOSIS — G8929 Other chronic pain: Secondary | ICD-10-CM | POA: Diagnosis not present

## 2023-02-14 DIAGNOSIS — M25511 Pain in right shoulder: Secondary | ICD-10-CM | POA: Diagnosis not present

## 2023-02-14 DIAGNOSIS — M5412 Radiculopathy, cervical region: Secondary | ICD-10-CM | POA: Diagnosis not present

## 2023-02-14 DIAGNOSIS — M778 Other enthesopathies, not elsewhere classified: Secondary | ICD-10-CM

## 2023-02-14 DIAGNOSIS — M25531 Pain in right wrist: Secondary | ICD-10-CM | POA: Diagnosis not present

## 2023-02-14 DIAGNOSIS — R200 Anesthesia of skin: Secondary | ICD-10-CM | POA: Diagnosis not present

## 2023-02-14 DIAGNOSIS — M79641 Pain in right hand: Secondary | ICD-10-CM | POA: Diagnosis not present

## 2023-02-14 MED ORDER — MELOXICAM 15 MG PO TABS: 15.0000 mg | ORAL_TABLET | Freq: Every day | ORAL | 0 refills | Status: DC | PRN

## 2023-02-14 MED ORDER — CYCLOBENZAPRINE HCL 5 MG PO TABS: 5.0000 mg | ORAL_TABLET | Freq: Three times a day (TID) | ORAL | 1 refills | Status: DC | PRN

## 2023-02-14 NOTE — Progress Notes (Signed)
Primary Care / Sports Medicine Office Visit  Patient Information:  Patient ID: Jill Woodard, female DOB: 2000-11-12 Age: 22 y.o. MRN: [id number]   Jill Woodard is a pleasant 22 y.o. female presenting with the following:  No chief complaint on file.   Vitals:   02/14/23 1045  BP: 130/80  Pulse: 96  SpO2: 99%   Vitals:   02/14/23 1045  Weight: 170 lb (77.1 kg)  Height: 5\' 5"  (1.651 m)   Body mass index is 28.29 kg/m.  No results found.   Independent interpretation of notes and tests performed by another provider:   Independent interpretation of right hand, wrist, and shoulder x-rays appear benign.  Independent interpretation of cervical spine x-rays dated 02/14/2023 demonstrate straightening of the expected cervical lordosis, there is a leftward tilt on AP view, no significant intervertebral space narrowing or degenerative changes otherwise, foramina patent bilaterally, no acute osseous processes identified.  Procedures performed:   None  Pertinent History, Exam, Impression, and Recommendations:   Cervical radicular pain Assessment & Plan: Brings up chronic neck tightness and stiffness, right upper extremity shooting pain, intermittent paresthesias, primarily localizing about the right wrist.  Additional assessment for details.  Examination today with limited range of motion secondary to muscular tightness, sensorimotor intact bilateral upper extremities, positive right-sided Spurling's.  Given her clinical symptoms, radiographic findings, concern for cervical radicular pain secondary to possible intervertebral disc disorder versus by biomechanical issues.  Plan as follows: - Start meloxicam daily x 2 weeks then daily as needed - Can dose muscle relaxer (cyclobenzaprine) up to 3 times a day, side effect can be drowsiness - Start formal physical therapy, referral coroner will contact for scheduling - Return in 2 months - Suboptimal progress to be  addressed with consideration of advanced imaging and referral to interventional spine group  Orders: -     Cyclobenzaprine HCl; Take 1 tablet (5 mg total) by mouth 3 (three) times daily as needed for muscle spasms.  Dispense: 60 tablet; Refill: 1 -     Meloxicam; Take 1 tablet (15 mg total) by mouth daily as needed for pain.  Dispense: 60 tablet; Refill: 0  Right wrist tendinitis Assessment & Plan: Continues to be symptomatic, primarily along the radial distribution, some improvement with brace.  Brings up symptoms going up and down her arm and neck stiffness.  Patient may have some degree of wrist tendinopathy given the response to cock up wrist brace however primary symptoms are stemming from cervical spine. See additional assessment(s) for plan details.  - Can continue wrist brace as needed - Start formal PT - Will be on meloxicam daily - Follow-up in 2 months, suboptimal progress can be addressed with consideration of advanced imaging, local cortisone injections as clinically guided   Orders: -     Meloxicam; Take 1 tablet (15 mg total) by mouth daily as needed for pain.  Dispense: 60 tablet; Refill: 0     Orders & Medications Meds ordered this encounter  Medications   cyclobenzaprine (FLEXERIL) 5 MG tablet    Sig: Take 1 tablet (5 mg total) by mouth 3 (three) times daily as needed for muscle spasms.    Dispense:  60 tablet    Refill:  1   meloxicam (MOBIC) 15 MG tablet    Sig: Take 1 tablet (15 mg total) by mouth daily as needed for pain.    Dispense:  60 tablet    Refill:  0   No orders of the  defined types were placed in this encounter.    No follow-ups on file.     Jerrol Banana, MD, [facility]   Primary Care Sports Medicine Primary Care and Sports Medicine at [facility]

## 2023-02-14 NOTE — Assessment & Plan Note (Signed)
Brings up chronic neck tightness and stiffness, right upper extremity shooting pain, intermittent paresthesias, primarily localizing about the right wrist.  Additional assessment for details.  Examination today with limited range of motion secondary to muscular tightness, sensorimotor intact bilateral upper extremities, positive right-sided Spurling's.  Given her clinical symptoms, radiographic findings, concern for cervical radicular pain secondary to possible intervertebral disc disorder versus by biomechanical issues.  Plan as follows: - Start meloxicam daily x 2 weeks then daily as needed - Can dose muscle relaxer (cyclobenzaprine) up to 3 times a day, side effect can be drowsiness - Start formal physical therapy, referral coroner will contact for scheduling - Return in 2 months - Suboptimal progress to be addressed with consideration of advanced imaging and referral to interventional spine group

## 2023-02-14 NOTE — Assessment & Plan Note (Signed)
Continues to be symptomatic, primarily along the radial distribution, some improvement with brace.  Brings up symptoms going up and down her arm and neck stiffness.  Patient may have some degree of wrist tendinopathy given the response to cock up wrist brace however primary symptoms are stemming from cervical spine. See additional assessment(s) for plan details.  - Can continue wrist brace as needed - Start formal PT - Will be on meloxicam daily - Follow-up in 2 months, suboptimal progress can be addressed with consideration of advanced imaging, local cortisone injections as clinically guided

## 2023-02-14 NOTE — Patient Instructions (Signed)
-   Start meloxicam daily x 2 weeks then daily as needed - Can dose muscle relaxer (cyclobenzaprine) up to 3 times a day, side effect can be drowsiness - Start formal physical therapy, referral coroner will contact for scheduling - Return in 2 months

## 2023-02-23 ENCOUNTER — Telehealth: Payer: Self-pay

## 2023-02-23 ENCOUNTER — Encounter: Payer: Medicaid Other | Admitting: Family Medicine

## 2023-02-23 NOTE — Telephone Encounter (Signed)
Pt notified of Chlamydia results.   Pt states that she will call and make an appointment for treatment.  Berdie Ogren, RN

## 2023-02-28 DIAGNOSIS — Z880 Allergy status to penicillin: Secondary | ICD-10-CM | POA: Diagnosis not present

## 2023-02-28 DIAGNOSIS — Z7982 Long term (current) use of aspirin: Secondary | ICD-10-CM | POA: Diagnosis not present

## 2023-02-28 DIAGNOSIS — G40909 Epilepsy, unspecified, not intractable, without status epilepticus: Secondary | ICD-10-CM | POA: Diagnosis not present

## 2023-02-28 DIAGNOSIS — Z79899 Other long term (current) drug therapy: Secondary | ICD-10-CM | POA: Diagnosis not present

## 2023-02-28 DIAGNOSIS — G40009 Localization-related (focal) (partial) idiopathic epilepsy and epileptic syndromes with seizures of localized onset, not intractable, without status epilepticus: Secondary | ICD-10-CM | POA: Diagnosis not present

## 2023-02-28 DIAGNOSIS — Z91018 Allergy to other foods: Secondary | ICD-10-CM | POA: Diagnosis not present

## 2023-03-02 ENCOUNTER — Telehealth: Payer: Self-pay

## 2023-03-02 NOTE — Telephone Encounter (Signed)
Received call from [facility] Member: Jill Woodard, regarding Depo administration.  Jill Woodard confirmed patient DOB.  Recorded reviewed. Jill Woodard informed last Depo given at [facility] on 02/13/2023.  [name] [name], RN

## 2023-03-03 DIAGNOSIS — G40909 Epilepsy, unspecified, not intractable, without status epilepticus: Secondary | ICD-10-CM | POA: Diagnosis not present

## 2023-03-05 DIAGNOSIS — G40909 Epilepsy, unspecified, not intractable, without status epilepticus: Secondary | ICD-10-CM | POA: Diagnosis not present

## 2023-03-07 DIAGNOSIS — G40909 Epilepsy, unspecified, not intractable, without status epilepticus: Secondary | ICD-10-CM | POA: Diagnosis not present

## 2023-03-08 DIAGNOSIS — R569 Unspecified convulsions: Secondary | ICD-10-CM | POA: Diagnosis not present

## 2023-03-09 ENCOUNTER — Telehealth: Payer: Self-pay

## 2023-03-09 DIAGNOSIS — R569 Unspecified convulsions: Secondary | ICD-10-CM | POA: Diagnosis not present

## 2023-03-09 NOTE — Transitions of Care (Post Inpatient/ED Visit) (Signed)
   03/09/2023  Name: Jill Woodard MRN: [id number] DOB: 2000-10-30  Today's TOC FU Call Status: Today's TOC FU Call Status:: Successful TOC FU Call Competed TOC FU Call Complete Date: 03/09/23  Transition Care Management Follow-up Telephone Call Date of Discharge: 03/08/23 Discharge Facility: Other ([facility]) Name of Other ([facility]) Discharge Facility: [facility] Type of Discharge: Inpatient Admission Primary Inpatient Discharge Diagnosis:: convulsions How have you been since you were released from the hospital?: Better Any questions or concerns?: No  Items Reviewed: Did you receive and understand the discharge instructions provided?: No Medications obtained,verified, and reconciled?: Yes (Medications Reviewed) Any new allergies since your discharge?: No Dietary orders reviewed?: Yes Do you have support at home?: Yes People in Home: parent(s)  Medications Reviewed Today: Medications Reviewed Today     Reviewed by Karena Addison, LPN (Licensed Practical Nurse) on 03/09/23 at [phone]  Med List Status: <None>   Medication Order Taking? Sig Documenting Provider Last Dose Status Informant  albuterol (PROVENTIL) (2.5 MG/3ML) 0.083% nebulizer solution [id number] No Inhale into the lungs. [provider] Taking Active   cloBAZam (ONFI PO) [id number] No Take 15 mg by mouth 2 (two) times daily. [provider] Taking Active   clonazePAM (KLONOPIN) 0.5 MG tablet [id number] No Take by mouth. [provider] Taking Active   cyclobenzaprine (FLEXERIL) 5 MG tablet [id number]  Take 1 tablet (5 mg total) by mouth 3 (three) times daily as needed for muscle spasms. Jerrol Banana, MD  Active   lamoTRIgine (LAMICTAL) 100 MG tablet [id number] No Take by mouth 2 (two) times daily. [provider] Taking Active   magic mouthwash (nystatin, lidocaine, diphenhydrAMINE, alum & mag hydroxide) suspension [id number] No Swish and swallow 5 mLs 3 (three) times daily as  needed for mouth pain. Katha Cabal, DO Taking Active   medroxyPROGESTERone (DEPO-PROVERA) 150 MG/ML injection [id number] No Inject into the muscle. [provider] Taking Expired 02/14/23 2359   medroxyPROGESTERone (DEPO-PROVERA) injection 150 mg [id number]   Lenice Llamas, FNP  Active   meloxicam (MOBIC) 15 MG tablet [id number]  Take 1 tablet (15 mg total) by mouth daily as needed for pain. Jerrol Banana, MD  Active   NAYZILAM 5 MG/0.1ML Criss Rosales [id number] No  [provider] Taking Active             Home Care and Equipment/Supplies: Were Home Health Services Ordered?: NA Any new equipment or medical supplies ordered?: NA  Functional Questionnaire: Do you need assistance with bathing/showering or dressing?: No Do you need assistance with meal preparation?: No Do you need assistance with eating?: No Do you have difficulty maintaining continence: No Do you need assistance with getting out of bed/getting out of a chair/moving?: No Do you have difficulty managing or taking your medications?: No  Follow up appointments reviewed: PCP Follow-up appointment confirmed?: NA Specialist Hospital Follow-up appointment confirmed?: Yes Date of Specialist follow-up appointment?: 03/10/23 Follow-Up Specialty Provider:: [facility] Do you need transportation to your follow-up appointment?: No Do you understand care options if your condition(s) worsen?: Yes-patient verbalized understanding    SIGNATURE Karena Addison, LPN [facility] Nurse Health Advisor Direct Dial [phone]

## 2023-03-15 ENCOUNTER — Encounter: Payer: Self-pay | Admitting: Physical Therapy

## 2023-03-15 ENCOUNTER — Other Ambulatory Visit: Payer: Self-pay | Admitting: Family Medicine

## 2023-03-15 ENCOUNTER — Ambulatory Visit: Payer: Medicaid Other | Admitting: Physical Therapy

## 2023-03-15 ENCOUNTER — Ambulatory Visit
Admission: EM | Admit: 2023-03-15 | Discharge: 2023-03-15 | Disposition: A | Discharge status: Home / Self Care | Payer: Medicaid Other | Attending: Family Medicine | Admitting: Family Medicine

## 2023-03-15 DIAGNOSIS — M778 Other enthesopathies, not elsewhere classified: Secondary | ICD-10-CM | POA: Insufficient documentation

## 2023-03-15 DIAGNOSIS — R569 Unspecified convulsions: Secondary | ICD-10-CM | POA: Diagnosis not present

## 2023-03-15 DIAGNOSIS — Z1152 Encounter for screening for COVID-19: Secondary | ICD-10-CM | POA: Diagnosis not present

## 2023-03-15 DIAGNOSIS — M6281 Muscle weakness (generalized): Secondary | ICD-10-CM | POA: Insufficient documentation

## 2023-03-15 DIAGNOSIS — J0100 Acute maxillary sinusitis, unspecified: Secondary | ICD-10-CM | POA: Diagnosis not present

## 2023-03-15 DIAGNOSIS — M5412 Radiculopathy, cervical region: Secondary | ICD-10-CM | POA: Diagnosis not present

## 2023-03-15 DIAGNOSIS — H9201 Otalgia, right ear: Secondary | ICD-10-CM | POA: Diagnosis present

## 2023-03-15 DIAGNOSIS — M542 Cervicalgia: Secondary | ICD-10-CM | POA: Diagnosis not present

## 2023-03-15 DIAGNOSIS — R519 Headache, unspecified: Secondary | ICD-10-CM | POA: Diagnosis present

## 2023-03-15 LAB — SARS CORONAVIRUS 2 BY RT PCR: SARS Coronavirus 2 by RT PCR: NEGATIVE

## 2023-03-15 MED ORDER — CEFDINIR 300 MG PO CAPS: 300.0000 mg | ORAL_CAPSULE | Freq: Two times a day (BID) | ORAL | 0 refills | Status: AC

## 2023-03-15 NOTE — Therapy (Signed)
OUTPATIENT PHYSICAL THERAPY NECK/UPPER QUARTER EVALUATION   Patient Name: Jill Woodard MRN: [id number] DOB:2001-04-06, 22 y.o., female Today's Date: 03/15/2023  END OF SESSION:  PT End of Session - 03/15/23 0944     Visit Number 1    Number of Visits 13    Date for PT Re-Evaluation 04/26/23    Authorization Type [place] Medicaid 2024    PT Start Time 1030    PT Stop Time 1117    PT Time Calculation (min) 47 min    Behavior During Therapy [facility] for tasks assessed/performed             Past Medical History:  Diagnosis Date   Allergy    Anxiety    Asthma    Depression with anxiety 01/03/2023   Epilepsy (HCC)    Herpes    Seizures (HCC)    Past Surgical History:  Procedure Laterality Date   ADENOIDECTOMY     BRAIN SURGERY     FINGER FRACTURE SURGERY     TYMPANOSTOMY TUBE PLACEMENT     Patient Active Problem List   Diagnosis Date Noted   Cervical radicular pain 02/14/2023   Depression with anxiety 01/03/2023   Right wrist tendinitis 01/03/2023   Focal seizures (HCC) 10/19/2022   Hx of migraines 12/07/2021   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 Ashley Royalty 04/19/23   SUBJECTIVE:                                                                                                                                                                                         Chief Complaint: 22 year old female with primary complaint of R-sided neck  pain and R upper limb referred pain; she also has pain along radial aspect of R wrist.  Pertinent History 22 year old female with primary complaint of R-sided neck pain and R upper limb referred pain; she also has pain along radial aspect of R wrist. Per referring physician, pt reports chronic neck tightness and stiffness, right upper extremity shooting pain, intermittent paresthesias. Past med Hx including seizure disorder, anxiety, depression.   Patient reports getting out of hospital due to seizures last week. Patient  reports having seizures some over last 2 days; today is better. Pt currently reports difficulty with turning her head to the R - more pain with forcing to R. She reports notable stiffness and feeling of knots along sides of neck and back of neck. She reports pain in shoulders. She reports pain with sitting up straight. She reports Hx of severe scoliosis. Pt reports no change with use of topical Biofreeze or use of wrist brace. Pt has vagus nerve stimulator on her L side. Pt reports difficulty with lying on back and having to keep neck slightly to L.   Pain:  Pain Intensity: Described as stiffness presently versus pain  Best: 3/10 at best, Worst: 7/10 Pain location: Pain along R lower paracervical region and UT. Intermittent pain into ulnar aspect of wrist and toward 5th digit Pain Quality:  ]shooting into R wrist/ulnar side, throbbing/jolting and tightness close to neck/UT   Radiating: Yes , to RUE down to R wrist  Numbness/Tingling: Yes, down to R wrist  Focal Weakness: Yes; pt reports some difficulty with gripping and moving items, dropped bowl by accident one time. Sometimes pinky into 4th-5th digits, sometimes 1st-2nd digit. Seldom on L side. NT is usually on R side.  Aggravating factors: turning head to R, lying flat on back Relieving factors: lying to L side, Acetaminophen, Meloxicam 24-hour pain behavior: worse in AM upon waking, worse again at mid-afternoon  History of  prior neck injury, pain, surgery, or therapy: No Dominant hand: right Imaging: Yes ;  02/14/23 Negative cervical spine radiographs.    Red flags (personal history of cancer, h/o spinal tumors, history of compression fracture, chills/fever, night sweats, nausea, vomiting, unrelenting pain): Chills/fever, nausea/vomiting and chills with seizure activity    PRECAUTIONS: Other: Seizure history   WEIGHT BEARING RESTRICTIONS: No  FALLS: Has patient fallen in last 6 months? 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   Hobbies: crotcheting, crafts  Patient Goals: Able to turn head better, improved stiffness, improved strength and ability to use R hand    OBJECTIVE:   Patient Surveys  FOTO 48, predicted outcome score 47  Cognition Patient is oriented to person, place, and time.  Recent memory is intact.  Remote memory is intact.  Attention span and concentration are intact.  Expressive speech is intact.  Patient's fund of knowledge is within normal limits for educational level.    Gross Musculoskeletal Assessment Tremor: None Bulk: Normal Tone: Normal   Posture Forward head, rounded shoulder posture; inc thoracic kyphosis, mild L rib hump (Hx of scoliosis)   AROM AROM (Normal range in degrees) AROM 03/15/2023  Cervical  Flexion (50) 46* (pull on R)  Extension (80) 46  Right lateral flexion (45) WNL*  Left lateral flexion (45) WNL*  Right rotation (85) 62*  Left rotation (85) 87 (mild pull on R)  (* = pain; Blank rows = not tested)   Shoulder AROM: Flexion WNL bilat, abduction 130 deg R WFL L Wrist AROM: WNL, pain with flexion and ulnar deviation on R    MMT MMT (out of 5) Right 03/15/2023 Left 03/15/2023      Shoulder   Flexion 4+ 4+  Extension     Abduction 4+ 4+  Internal rotation 5 5  Horizontal abduction    Horizontal adduction    Lower  Trapezius    Rhomboids        Elbow  Flexion 5 5  Extension 5 5  Pronation    Supination        Wrist  Flexion 5   Extension 5   Radial deviation 5   Ulnar deviation 5       (* = pain; Blank rows = not tested)  Sensation Grossly intact to light touch bilateral UE as determined by testing dermatomes C2-T2. Proprioception and hot/cold testing deferred on this date.  Reflexes R/L Elbow: 2+/2+  Brachioradialis: 2+/2+  Tricep: 2+/2+ UMN signs: Hoffman's negative and Clonus negative (only 1-2 beats per foot)    Palpation Location LEFT  RIGHT           Suboccipitals 0 1 (mild)  Cervical paraspinals 0 1  Upper Trapezius  0  Levator Scapulae  1  Rhomboid Major/Minor  1  (Blank rows = not tested) Graded on 0-4 scale (0 = no pain, 1 = pain, 2 = pain with wincing/grimacing/flinching, 3 = pain with withdrawal, 4 = unwilling to allow palpation), (Blank rows = not tested)  Repeated Movements Repeated cervical retraction: R UT tightness during, no worse after  Passive Accessory Intervertebral Motion Deferred   SPECIAL TESTS Spurlings A (ipsilateral lateral flexion/axial compression): R: Positive L: Negative Distraction Test: Positive  Hoffman Sign (cervical cord compression): R: Negative L: Negative      TODAY'S TREATMENT    Therapeutic Exercise - for HEP establishment, discussion on appropriate exercise/activity modification, PT education   Reviewed baseline home exercises and provided handout for MedBridge program (see Access Code); tactile cueing and therapist demonstration utilized as needed for carryover of proper technique to HEP.    Patient education on current condition, anatomy involved, prognosis, plan of care. Discussion on activity modification to prevent flare-up of condition, including use of upright back support with prolonged sitting, avoidance of prolonged  protracted cervical spine, and use of self-traction prn.      PATIENT EDUCATION:  Education details: see above for patient education details Person educated: Patient Education method: Explanation, Demonstration, and Handouts Education comprehension: verbalized understanding and returned demonstration   HOME EXERCISE PROGRAM:  Access Code: [email or web address] URL: [email or web address] Date: 03/15/2023 Prepared by: Consuela Mimes  Exercises - Supine Chin Tuck  - 6 x daily - 7 x weekly - 1 sets - 10 reps - 1sec hold - Seated Self Cervical Traction  - 7 x weekly - 1 sets - 10 reps - 5-10sec hold   ASSESSMENT:  CLINICAL IMPRESSION: Patient is a 22 y.o. female who was seen today for physical therapy evaluation and treatment for R-sided neck pain with R upper limb referred symptoms. Pt has secondary area of pain noted by referring physician along R wrist - based on description of symptoms and exam, this may be referred from cervical spine versus distinct wrist injury. Pt does have prehensile and fine motor deficits in R upper limb with pt dropping some items. Pt has comorbid scoliosis with L convexity. Attempted sustained sidebend to L for stretching today peripheralized symptoms; HEP was maintained in sagittal plane and pt was given self-traction technique for relief prn. Pt has current deficits in C-spine AROM, R-sided neck pain and R upper limb paresthesias, postural changes, and positional intolerance for lying supine. Pt will continue to benefit from skilled PT services to address deficits and improve function.   OBJECTIVE IMPAIRMENTS: decreased ROM, decreased strength, hypomobility, impaired UE functional use, postural dysfunction, and pain.   ACTIVITY LIMITATIONS: carrying,  lifting, and sleeping  PARTICIPATION LIMITATIONS: occupation and crocheting  PERSONAL FACTORS: Past/current experiences, Time since onset of injury/illness/exacerbation, and 3+ comorbidities: (seizure  disorder, anxiety, depression)  are also affecting patient's functional outcome.   REHAB POTENTIAL: Good  CLINICAL DECISION MAKING: Evolving/moderate complexity  EVALUATION COMPLEXITY: Moderate   GOALS: Goals reviewed with patient? Yes  SHORT TERM GOALS: Target date: 04/05/2023  Pt will be independent with HEP to improve strength and decrease neck pain to improve pain-free function at home and work. Baseline: 03/15/23:  Goal status: INITIAL   LONG TERM GOALS: Target date: 04/27/2023  Pt will increase FOTO to at least 62 to demonstrate significant improvement in function at home and work related to neck pain  Baseline: 03/15/23: 48 Goal status: INITIAL  2.  Pt will decrease worst neck pain by at least 2 points on the NPRS in order to demonstrate clinically significant reduction in neck pain. Baseline: 03/15/23:  Goal status: INITIAL  3.  Pt will have full cervical spine AROM without reproduction of symptoms as needed for scanning environment and performing reaching and overhead activity Baseline: 03/15/23: Motion loss with flexion, extension, and R rotation; pain with flexion, bilat lateral flexion, R rotation.  Goal status: INITIAL  4.  Patient will sleep through the night and report no disturbance with position changes as needed for improved sleep quality and long-term health/wellness Baseline: 03/15/23: Difficulty with lying to R side and with lying on back, interference with sleep Goal status: INITIAL   PLAN: PT FREQUENCY: 2x/week  PT DURATION: 6 weeks  PLANNED INTERVENTIONS: Therapeutic exercises, Patient/Family education, Self Care, Joint mobilization, Joint manipulation, Dry Needling, Electrical stimulation, Spinal manipulation, Spinal mobilization, Cryotherapy, Moist heat, Taping, Traction, Manual therapy, and Re-evaluation.  PLAN FOR NEXT SESSION: Check ULTT, re-assess repeated movement. Use cervical traction for symptom modulation. Check thoracic mobility. STM and  C-spine mobilization.     Consuela Mimes, PT, DPT #[id number]  Gertie Exon, PT 03/15/2023, 1:04 PM

## 2023-03-15 NOTE — Discharge Instructions (Addendum)
Stop by the pharmacy to pick up your prescription.  Follow up with your neurologist about your seizures.   Go to ED for red flag symptoms, including; fevers you cannot reduce with Tylenol/Motrin, severe headaches, vision changes, numbness/weakness in part of the body, lethargy, confusion, intractable vomiting, severe dehydration, chest pain, breathing difficulty, severe persistent abdominal or pelvic pain, signs of severe infection (increased redness, swelling of an area), feeling faint or passing out, dizziness, etc. You should especially go to the ED for sudden acute worsening of condition if you do not elect to go at this time.

## 2023-03-15 NOTE — ED Provider Notes (Signed)
[facility]    CSN: [id number] Arrival date & time: 03/15/23  1324      History   Chief Complaint Chief Complaint  Patient presents with   Covid Exposure   Headache   Facial Pain    HPI Jill Woodard is a 22 y.o. female.   HPI  History obtained from the patient. Jill Woodard presents for pain at right cheek that radiates to right ear.  Earlier this month, had a sinus infection after having wisdom teeth removed on 7/3. She was treated herself for sinus infection Sudafed, DayTime Cold and Flu and took some Tylenol.  She has headache for 2 weeks. No fever. Requests COVID test.   Has history of seizures and head seizures the past 2 days. Had one 2 AM this morning. She believes she had a seizure in her sleep. She didn't urinate on herself. She has not alerted her neurologist. Take Anfe 15 mg BID, Clonipin 0.25 mg BID, Lamictal 100 mg BID.  She had not used her intranasal spray as she hasn't needed it.        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 Authorizing  Provider  albuterol (PROVENTIL) (2.5 MG/3ML) 0.083% nebulizer solution Inhale into the lungs. 06/04/18  Yes [provider]  cefdinir (OMNICEF) 300 MG capsule Take 1 capsule (300 mg total) by mouth 2 (two) times daily for 7 days. 03/15/23 03/22/23 Yes Wilmer Berryhill, DO  cloBAZam (ONFI PO) Take 15 mg by mouth 2 (two) times daily.   Yes [provider]  clonazePAM (KLONOPIN) 0.5 MG tablet Take by mouth. 10/19/22 10/19/23 Yes [provider]  folic acid (FOLVITE) 1 MG tablet Take by mouth. 10/19/22 10/19/23 Yes [provider]  lamoTRIgine (LAMICTAL) 100 MG tablet Take by mouth 2 (two) times daily. 10/19/22 10/19/23 Yes [provider]  magic mouthwash (nystatin, lidocaine, diphenhydrAMINE, alum & mag hydroxide) suspension Swish and swallow 5 mLs 3 (three) times daily as needed for mouth pain. 03/26/22  Yes Keegan Bensch, Seward Meth, DO  medroxyPROGESTERone (DEPO-PROVERA) 150 MG/ML injection Inject into the muscle. 02/20/17 03/15/23 Yes [provider]  NAYZILAM 5 MG/0.1ML SOLN  06/14/22  Yes [provider]  cyclobenzaprine (FLEXERIL) 5 MG tablet TAKE  1 TABLET BY MOUTH 3 TIMES DAILY AS NEEDED FOR MUSCLE SPASMS 03/17/23   Jerrol Banana, MD  meloxicam (MOBIC) 15 MG tablet TAKE 1 TABLET BY MOUTH ONCE DAILY AS NEEDED FOR PAIN 03/17/23   Jerrol Banana, MD    Family History Family History  Problem Relation Age of Onset   Anxiety disorder Mother    Arthritis Mother    Cancer Mother    Obesity Mother    Varicose Veins Mother    Ovarian cysts Mother    Alcohol abuse Father    Drug abuse Father    Depression Sister    ADD / ADHD Sister    Anxiety disorder Sister    Vision loss Sister    Arthritis Maternal Grandmother    Asthma Maternal Grandmother    Diabetes Maternal Grandmother    Hearing loss Maternal Grandmother    Obesity Maternal Grandmother    Cancer Maternal Grandfather    Varicose Veins Maternal Grandfather    Cancer Paternal Grandfather      Social History Social History   Tobacco Use   Smoking status: Never    Passive exposure: Never  Vaping Use   Vaping status: Never Used  Substance Use Topics   Alcohol use: No   Drug use: No     Allergies   Amoxicillin, Dust mite extract, Other, Grapefruit extract, and Voltaren [diclofenac sodium]   Review of Systems Review of Systems: negative unless otherwise stated in HPI.      Physical Exam Triage Vital Signs ED Triage Vitals  Encounter Vitals Group     BP      Systolic BP Percentile      Diastolic BP Percentile      Pulse      Resp      Temp      Temp src      SpO2      Weight      Height      Head Circumference      Peak Flow      Pain Score      Pain Loc      Pain Education      Exclude from Growth Chart    No data found.  Updated Vital Signs Pulse 99   Temp 98.6 F (37 C) (Oral)   Resp 16   Ht 5\' 7"  (1.702 m)   Wt 79.4 kg   SpO2 100%   BMI 27.41 kg/m   Visual Acuity Right Eye Distance:   Left Eye Distance:   Bilateral Distance:    Right Eye Near:   Left Eye Near:    Bilateral Near:     Physical Exam GEN:     alert, non-toxic appearing female in no distress    HENT:  mucus membranes moist, oropharyngeal without lesions or erythema, no tonsillar hypertrophy or exudates, no nasal discharge, right maxillary sinus tenderness, bilateral TM normal EYES:   pupils equal and reactive, no scleral injection or discharge NECK:  normal ROM, no lymphadenopathy, no meningismus   RESP:  no increased work of breathing, clear to auscultation bilaterally CVS:   regular rate and rhythm NEURO:  alert, oriented, speech normal, CN 2-12 grossly intact, no facial droop,  sensation grossly intact, strength 5/5 bilateral UE and LE, normal coordination Skin:   warm and dry, no rash on visible skin    UC Treatments / Results  Labs (all labs ordered are listed, but only abnormal results are displayed) Labs Reviewed  SARS CORONAVIRUS 2 BY RT PCR     EKG   Radiology No results found.  Procedures Procedures (including critical care time)  Medications Ordered in UC Medications - No data to display  Initial Impression / Assessment and Plan / UC Course  I have reviewed the triage vital signs and the nursing notes.  Pertinent labs & imaging results that were available during my care of the patient were reviewed by me and considered in my medical decision making (see chart for details).       Pt is a 22 y.o. female who presents for 2 weeks of facial pain and headache.  Gianella is afebrile here without recent antipyretics. Satting well on room air. Overall pt is non-toxic appearing, well hydrated, without respiratory distress. Pulmonary exam is unremarkable.  COVID testing obtained and was negative. Given sinus congestion and headache with maxillary sinus tenderness after recent dental surgery will treat with antibiotics as below for sinus infection.  Discussed symptomatic treatment.   Typical duration of symptoms discussed.   She has epilepsy and having seizures, advised pt speak with her neurologist about her medications and headache if persists despite antibiotics for sinus infection. Neurological exam is unremarkable. Use intranasal benzo for seizures if needed. EMS/ED precautions given and voiced understanding.   Discussed MDM, treatment plan and plan for follow-up with patient who agrees with plan.     Final Clinical Impressions(s) / UC Diagnoses   Final diagnoses:  Acute non-recurrent maxillary sinusitis  Seizures (HCC)     Discharge Instructions      Stop by the pharmacy to pick up your prescription.  Follow up with your neurologist about your seizures.   Go to ED for red flag symptoms, including; fevers you cannot reduce with Tylenol/Motrin, severe headaches, vision changes, numbness/weakness in part of the body, lethargy, confusion, intractable vomiting, severe dehydration, chest pain, breathing difficulty,  severe persistent abdominal or pelvic pain, signs of severe infection (increased redness, swelling of an area), feeling faint or passing out, dizziness, etc. You should especially go to the ED for sudden acute worsening of condition if you do not elect to go at this time.       ED Prescriptions     Medication Sig Dispense Auth. Provider   cefdinir (OMNICEF) 300 MG capsule Take 1 capsule (300 mg total) by mouth 2 (two) times daily for 7 days. 14 capsule Trai Ells, DO      PDMP not reviewed this encounter.   Katha Cabal, DO 03/18/23 [phone]

## 2023-03-15 NOTE — ED Triage Notes (Signed)
Pt c/o HA & facial pain x2 wks. States was exposed to covid. Has tried sudafed & cold/flu pill w/o relief.

## 2023-03-16 NOTE — Telephone Encounter (Signed)
Requested medication (s) are due for refill today: Yes  Requested medication (s) are on the active medication list: Yes  Last refill:  02/14/23  Future visit scheduled:   Notes to clinic:  Manual review.    Requested Prescriptions  Pending Prescriptions Disp Refills   meloxicam (MOBIC) 15 MG tablet [Pharmacy Med Name: MELOXICAM 15 MG TAB] 60 tablet 0    Sig: TAKE 1 TABLET BY MOUTH ONCE DAILY AS NEEDED FOR PAIN     Analgesics:  COX2 Inhibitors Failed - 03/15/2023  5:48 PM      Failed - Manual Review: Labs are only required if the patient has taken medication for more than 8 weeks.      Failed - HGB in normal range and within 360 days    Hemoglobin  Date Value Ref Range Status  02/27/2015 15.1 12.0 - 16.0 g/dL Final         Failed - Cr in normal range and within 360 days    Creatinine, Ser  Date Value Ref Range Status  02/27/2015 0.64 0.50 - 1.00 mg/dL Final         Failed - HCT in normal range and within 360 days    HCT  Date Value Ref Range Status  02/27/2015 45.5 35.0 - 47.0 % Final         Failed - AST in normal range and within 360 days    No results found for: "POCAST", "AST"       Failed - ALT in normal range and within 360 days    No results found for: "ALT", "LABALT", "POCALT"       Failed - eGFR is 30 or above and within 360 days    GFR calc Af Amer  Date Value Ref Range Status  02/27/2015 NOT CALCULATED >60 mL/min Final    Comment:    (NOTE) The eGFR has been calculated using the CKD EPI equation. This calculation has not been validated in all clinical situations. eGFR's persistently <60 mL/min signify possible Chronic Kidney Disease.    GFR calc non Af Amer  Date Value Ref Range Status  02/27/2015 NOT CALCULATED >60 mL/min Final         Passed - Patient is not pregnant      Passed - Valid encounter within last 12 months    Recent Outpatient Visits           1 month ago Cervical radicular pain   [facility], Ocie Bob, MD   2 months ago Depression with anxiety   [facility], Ocie Bob, MD       Future Appointments             In 1 month Ashley Royalty, Ocie Bob, MD [facility] at [facility]             cyclobenzaprine (FLEXERIL) 5 MG tablet [Pharmacy Med Name: CYCLOBENZAPRINE HCL 5 MG TAB] 60 tablet 1    Sig: TAKE 1 TABLET BY MOUTH 3 TIMES DAILY AS NEEDED FOR MUSCLE SPASMS     Not Delegated - Analgesics:  Muscle Relaxants Failed - 03/15/2023  5:48 PM      Failed - This refill cannot be delegated      Passed - Valid encounter within last 6 months    Recent Outpatient Visits  1 month ago Cervical radicular pain   [facility] at [facility], Ocie Bob, MD   2 months ago Depression with anxiety   [facility] at [facility], Ocie Bob, MD       Future Appointments             In 1 month Ashley Royalty, Ocie Bob, MD [facility] at [facility], [facility]

## 2023-03-16 NOTE — Telephone Encounter (Signed)
Please review.  KP

## 2023-03-20 ENCOUNTER — Ambulatory Visit: Payer: Medicaid Other | Attending: Family Medicine | Admitting: Physical Therapy

## 2023-03-20 ENCOUNTER — Encounter: Payer: Self-pay | Admitting: Physical Therapy

## 2023-03-20 DIAGNOSIS — M542 Cervicalgia: Secondary | ICD-10-CM | POA: Diagnosis not present

## 2023-03-20 DIAGNOSIS — M6281 Muscle weakness (generalized): Secondary | ICD-10-CM | POA: Diagnosis not present

## 2023-03-20 NOTE — Therapy (Signed)
OUTPATIENT PHYSICAL THERAPY TREATMENT   Patient Name: Jill Woodard MRN: [id number] DOB:[birth year], 22 y.o., female Today's Date: 03/20/2023  END OF SESSION:  PT End of Session - 03/20/23 0841     Visit Number 2    Number of Visits 13    Date for PT Re-Evaluation 04/26/23    Authorization Type [place] Medicaid 2024    PT Start Time 0903    PT Stop Time 0945    PT Time Calculation (min) 42 min    Behavior During Therapy [facility] for tasks assessed/performed             Past Medical History:  Diagnosis Date   Allergy    Anxiety    Asthma    Depression with anxiety 01/03/2023   Epilepsy (HCC)    Herpes    Seizures (HCC)    Past Surgical History:  Procedure Laterality Date   ADENOIDECTOMY     BRAIN SURGERY     FINGER FRACTURE SURGERY     TYMPANOSTOMY TUBE PLACEMENT     Patient Active Problem List   Diagnosis Date Noted   Cervical radicular pain 02/14/2023   Depression with anxiety 01/03/2023   Right wrist tendinitis 01/03/2023   Focal seizures (HCC) 10/19/2022   Hx of migraines 12/07/2021   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 Ashley Royalty 04/19/23  Pertinent History 22 year old female with primary complaint of R-sided neck pain and R upper limb referred pain; she also has pain along radial aspect of R wrist. Per referring physician, pt reports chronic neck tightness and stiffness, right upper extremity shooting pain, intermittent  paresthesias. Past med Hx including seizure disorder, anxiety, depression.   Patient reports getting out of hospital due to seizures last week. Patient reports having seizures some over last 2 days; today is better. Pt currently reports difficulty with turning her head to the R - more pain with forcing to R. She reports notable stiffness and feeling of knots along sides of neck and back of neck. She reports pain in shoulders. She reports pain with sitting up straight. She reports Hx of severe scoliosis. Pt reports no change with use of topical Biofreeze or use of wrist brace. Pt has vagus nerve stimulator on her L side. Pt reports difficulty with lying on back and having to keep neck slightly to L.   Pain:  Pain Intensity: Described as stiffness presently versus pain  Best: 3/10 at best, Worst: 7/10 Pain location: Pain along R lower paracervical region and UT. Intermittent pain into ulnar aspect of wrist and toward 5th digit Pain Quality:  ]shooting into R wrist/ulnar side, throbbing/jolting and tightness close to neck/UT   Radiating: Yes , to RUE down to R wrist  Numbness/Tingling: Yes, down to R wrist  Focal Weakness: Yes; pt reports some difficulty with gripping and moving items, dropped bowl by accident one time. Sometimes pinky into 4th-5th digits, sometimes 1st-2nd digit. Seldom on L side. NT is usually on R side.  Aggravating factors: turning head to  R, lying flat on back Relieving factors: lying to L side, Acetaminophen, Meloxicam 24-hour pain behavior: worse in AM upon waking, worse again at mid-afternoon  History of prior neck injury, pain, surgery, or therapy: No Dominant hand: right Imaging: Yes ;  02/14/23 Negative cervical spine radiographs.    Red flags (personal history of cancer, h/o spinal tumors, history of compression fracture, chills/fever, night sweats, nausea, vomiting, unrelenting pain): Chills/fever, nausea/vomiting and chills with seizure activity    PRECAUTIONS:  Other: Seizure history   WEIGHT BEARING RESTRICTIONS: No  FALLS: Has patient fallen in last 6 months? 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   Hobbies: crotcheting, crafts  Patient Goals: Able to turn head better, improved stiffness, improved strength and ability to use R hand     OBJECTIVE:   Posture Forward head, rounded shoulder posture; inc thoracic kyphosis, mild L rib hump (Hx of scoliosis)   AROM AROM (Normal range in degrees) AROM 03/15/23  Cervical  Flexion (50) 46* (pull on R)  Extension (80) 46  Right lateral flexion (45) WNL*  Left lateral flexion (45) WNL*  Right rotation (85) 62*  Left rotation (85) 87 (mild pull on R)  (* = pain; Blank rows = not tested)   Shoulder AROM: Flexion WNL bilat, abduction 130 deg R WFL L Wrist AROM: WNL, pain with flexion and ulnar deviation on R    MMT MMT (out of 5) Right 03/15/23 Left 03/15/23      Shoulder   Flexion 4+ 4+  Extension    Abduction 4+ 4+  Internal rotation 5 5  Horizontal abduction    Horizontal adduction    Lower Trapezius    Rhomboids        Elbow  Flexion 5 5  Extension 5 5  Pronation    Supination        Wrist  Flexion 5   Extension 5   Radial deviation 5   Ulnar deviation 5       (* = pain; Blank rows = not tested)  Sensation Grossly intact to light touch bilateral UE as determined by testing dermatomes C2-T2. Proprioception and hot/cold testing deferred on this date.  Reflexes R/L Elbow: 2+/2+  Brachioradialis: 2+/2+  Tricep: 2+/2+ [facility] signs: Hoffman's negative and Clonus negative (only 1-2 beats per foot)    Palpation Location LEFT  RIGHT           Suboccipitals 0 1 (mild)  Cervical paraspinals 0 1  Upper Trapezius  0  Levator Scapulae  1  Rhomboid  Major/Minor  1  (Blank rows = not tested) Graded on 0-4 scale (0 = no pain, 1 = pain, 2 = pain with wincing/grimacing/flinching, 3 = pain with withdrawal, 4 = unwilling to allow palpation), (Blank rows = not tested)  Repeated Movements Repeated cervical retraction: R UT tightness during, no worse after  Passive Accessory Intervertebral Motion Deferred   SPECIAL TESTS Spurlings A (ipsilateral lateral flexion/axial compression): R: Positive L: Negative Distraction Test: Positive  Hoffman Sign (cervical cord compression): R: Negative L: Negative      TODAY'S TREATMENT   SUBJECTIVE STATEMENT: Patent reports having seizures Monday and Tuesday - she reports that her coordination is "a little off" due to this. Patient reports she has another f/u with neurology on Thursday. Pt reports doing  well with self-traction technique. Patient reports 5/10 pain affecting her neck along R paracervical region.    Manual Therapy - for symptom modulation, soft tissue sensitivity and mobility, joint mobility, C-spine ROM   Manual cervical traction in supine; 10 sec on, 10 sec off; x 5 minutes for nerve root decompression and symptom modulation  STM/DTM R splenius cervicis/capitis/ R upper trapezius; x 15 minutes  SOR; x 3 minutes     Therapeutic Exercise - for improved soft tissue flexibility and extensibility as needed for ROM, repeated movement for symptom modulation and to improve overall ROM   AROM Cervical flexion:  WNL Cervical extension: min motion loss Lateral flexion: Right WNL* , Left WNL Cervical rotation: Right 75%*, Left WNL *Indicates pain   Repeated cervical retraction, supine; 1x10, 1 sec hold  Repeated cervical retraction-extension, seated; 2x10, 1 sec at end-range   PATIENT EDUCATION: Discussed role of repeated movement, self-monitoring for any change or any increase in peripheral symptoms and following up with PT on any occurrence of upper limb numbness/tingling. HEP  updated and new printout given today.     PATIENT EDUCATION:  Education details: see above for patient education details Person educated: Patient Education method: Explanation, Demonstration, and Handouts Education comprehension: verbalized understanding and returned demonstration   HOME EXERCISE PROGRAM:  Access Code: [email or web address] URL: [email or web address] Date: 03/20/2023 Prepared by: Consuela Mimes  Exercises - Seated Cervical Retraction and Extension  - 5-6 x daily - 7 x weekly - 1 sets - 10 reps - 1sec hold - Seated Self Cervical Traction  - 7 x weekly - 1 sets - 10 reps - 5-10sec hold   ASSESSMENT:  CLINICAL IMPRESSION: Patient arrives with moderate-level soreness along R paracervical musculature and R upper trap. She tolerates repeated retraction well, and she has ongoing remaining R-sided soreness that is unchanged. Pt has fleeting sharp pain in neck with repeated extension that subsides after exercise is completed; she describes central discomfort as soreness following second set of repeated extension. Pt does demonstrate improved ROM baselines today with only minimal motion loss with extension and R rotation. Pt to continue with extension-bias repeated movement program and f/u with PT on any significant symptoms. Pt reports feeling better today leaving versus her symptoms at arrival. Patient has remaining deficits in C-spine AROM, R-sided neck pain and R upper limb paresthesias, postural changes, and positional intolerance for lying supine. Patient will benefit from continued skilled therapeutic intervention to address the above deficits as needed for improved function and QoL.    OBJECTIVE IMPAIRMENTS: decreased ROM, decreased strength, hypomobility, impaired UE functional use, postural dysfunction, and pain.   ACTIVITY LIMITATIONS: carrying, lifting, and sleeping  PARTICIPATION LIMITATIONS: occupation and crocheting  PERSONAL FACTORS: Past/current  experiences, Time since onset of injury/illness/exacerbation, and 3+ comorbidities: (seizure disorder, anxiety, depression)  are also affecting patient's functional outcome.   REHAB POTENTIAL: Good  CLINICAL DECISION MAKING: Evolving/moderate complexity  EVALUATION COMPLEXITY: Moderate   GOALS: Goals reviewed with patient? Yes  SHORT TERM GOALS: Target date: 04/05/2023  Pt will be independent with HEP to improve strength and decrease neck pain to improve pain-free function at home and work. Baseline: 03/15/23:  Goal status: INITIAL   LONG TERM GOALS: Target date: 04/27/2023  Pt will increase FOTO to at least 62 to demonstrate significant improvement in function at home and work related to neck pain  Baseline: 03/15/23: 48 Goal status: INITIAL  2.  Pt will decrease worst neck pain by at least 2 points on the NPRS in order  to demonstrate clinically significant reduction in neck pain. Baseline: 03/15/23:  Goal status: INITIAL  3.  Pt will have full cervical spine AROM without reproduction of symptoms as needed for scanning environment and performing reaching and overhead activity Baseline: 03/15/23: Motion loss with flexion, extension, and R rotation; pain with flexion, bilat lateral flexion, R rotation.  Goal status: INITIAL  4.  Patient will sleep through the night and report no disturbance with position changes as needed for improved sleep quality and long-term health/wellness Baseline: 03/15/23: Difficulty with lying to R side and with lying on back, interference with sleep Goal status: INITIAL   PLAN: PT FREQUENCY: 2x/week  PT DURATION: 6 weeks  PLANNED INTERVENTIONS: Therapeutic exercises, Patient/Family education, Self Care, Joint mobilization, Joint manipulation, Dry Needling, Electrical stimulation, Spinal manipulation, Spinal mobilization, Cryotherapy, Moist heat, Taping, Traction, Manual therapy, and Re-evaluation.  PLAN FOR NEXT SESSION: Check ULTT, re-assess repeated  movement. Use cervical traction for symptom modulation. Check thoracic mobility. STM and C-spine mobilization.     Consuela Mimes, PT, DPT #[id number]  Gertie Exon, PT 03/20/2023, 9:03 AM

## 2023-03-22 ENCOUNTER — Ambulatory Visit: Payer: Medicaid Other | Admitting: Physical Therapy

## 2023-03-22 DIAGNOSIS — M542 Cervicalgia: Secondary | ICD-10-CM | POA: Diagnosis not present

## 2023-03-22 DIAGNOSIS — M6281 Muscle weakness (generalized): Secondary | ICD-10-CM

## 2023-03-22 NOTE — Therapy (Signed)
OUTPATIENT PHYSICAL THERAPY TREATMENT   Patient Name: Jill Woodard MRN: [id number] DOB:2001-08-09, 22 y.o., female Today's Date: 03/22/2023  END OF SESSION:  PT End of Session - 03/22/23 1100     Visit Number 3    Number of Visits 13    Date for PT Re-Evaluation 04/26/23    Authorization Type [place] Medicaid 2024    PT Start Time 1030    PT Stop Time 1111    PT Time Calculation (min) 41 min    Behavior During Therapy [facility] for tasks assessed/performed              Past Medical History:  Diagnosis Date   Allergy    Anxiety    Asthma    Depression with anxiety 01/03/2023   Epilepsy (HCC)    Herpes    Seizures (HCC)    Past Surgical History:  Procedure Laterality Date   ADENOIDECTOMY     BRAIN SURGERY     FINGER FRACTURE SURGERY     TYMPANOSTOMY TUBE PLACEMENT     Patient Active Problem List   Diagnosis Date Noted   Cervical radicular pain 02/14/2023   Depression with anxiety 01/03/2023   Right wrist tendinitis 01/03/2023   Focal seizures (HCC) 10/19/2022   Hx of migraines 12/07/2021   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 Ashley Royalty 04/19/23  Pertinent History 22 year old female with primary complaint of R-sided neck pain and R upper limb referred pain; she also has pain along radial aspect of R wrist. Per referring physician, pt reports chronic neck tightness and stiffness, right upper extremity shooting pain, intermittent  paresthesias. Past med Hx including seizure disorder, anxiety, depression.   Patient reports getting out of hospital due to seizures last week. Patient reports having seizures some over last 2 days; today is better. Pt currently reports difficulty with turning her head to the R - more pain with forcing to R. She reports notable stiffness and feeling of knots along sides of neck and back of neck. She reports pain in shoulders. She reports pain with sitting up straight. She reports Hx of severe scoliosis. Pt reports no change with use of topical Biofreeze or use of wrist brace. Pt has vagus nerve stimulator on her L side. Pt reports difficulty with lying on back and having to keep neck slightly to L.   Pain:  Pain Intensity: Described as stiffness presently versus pain  Best: 3/10 at best, Worst: 7/10 Pain location: Pain along R lower paracervical region and UT. Intermittent pain into ulnar aspect of wrist and toward 5th digit Pain Quality:  ]shooting into R wrist/ulnar side, throbbing/jolting and tightness close to neck/UT   Radiating: Yes , to RUE down to R wrist  Numbness/Tingling: Yes, down to R wrist  Focal Weakness: Yes; pt reports some difficulty with gripping and moving items, dropped bowl by accident one time. Sometimes pinky into 4th-5th digits, sometimes 1st-2nd digit. Seldom on L side. NT is usually on R side.  Aggravating factors: turning head  to R, lying flat on back Relieving factors: lying to L side, Acetaminophen, Meloxicam 24-hour pain behavior: worse in AM upon waking, worse again at mid-afternoon  History of prior neck injury, pain, surgery, or therapy: No Dominant hand: right Imaging: Yes ;  02/14/23 Negative cervical spine radiographs.    Red flags (personal history of cancer, h/o spinal tumors, history of compression fracture, chills/fever, night sweats, nausea, vomiting, unrelenting pain): Chills/fever, nausea/vomiting and chills with seizure activity    PRECAUTIONS:  Other: Seizure history   WEIGHT BEARING RESTRICTIONS: No  FALLS: Has patient fallen in last 6 months? 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   Hobbies: crotcheting, crafts  Patient Goals: Able to turn head better, improved stiffness, improved strength and ability to use R hand     OBJECTIVE:   Posture Forward head, rounded shoulder posture; inc thoracic kyphosis, mild L rib hump (Hx of scoliosis)   AROM AROM (Normal range in degrees) AROM 03/15/23  Cervical  Flexion (50) 46* (pull on R)  Extension (80) 46  Right lateral flexion (45) WNL*  Left lateral flexion (45) WNL*  Right rotation (85) 62*  Left rotation (85) 87 (mild pull on R)  (* = pain; Blank rows = not tested)  (03/22/23) Thoracic AROM: WNL with exception of moderate motion loss and low back pain with thoracic extension   Shoulder AROM: Flexion WNL bilat, abduction 130 deg R WFL L Wrist AROM: WNL, pain with flexion and ulnar deviation on R    MMT MMT (out of 5) Right 03/15/23 Left 03/15/23      Shoulder   Flexion 4+ 4+  Extension    Abduction 4+ 4+  Internal rotation 5 5  Horizontal abduction    Horizontal adduction    Lower Trapezius    Rhomboids        Elbow  Flexion 5 5  Extension 5 5  Pronation    Supination        Wrist  Flexion 5   Extension 5   Radial deviation 5   Ulnar deviation 5       (* = pain; Blank rows = not tested)  Sensation Grossly intact to light touch bilateral UE as determined by testing dermatomes C2-T2. Proprioception and hot/cold testing deferred on this date.  Reflexes R/L Elbow: 2+/2+  Brachioradialis: 2+/2+  Tricep: 2+/2+ UMN signs: Hoffman's negative and Clonus negative (only 1-2 beats per foot)    Palpation Location LEFT  RIGHT            Suboccipitals 0 1 (mild)  Cervical paraspinals 0 1  Upper Trapezius  0  Levator Scapulae  1  Rhomboid Major/Minor  1  (Blank rows = not tested) Graded on 0-4 scale (0 = no pain, 1 = pain, 2 = pain with wincing/grimacing/flinching, 3 = pain with withdrawal, 4 = unwilling to allow palpation), (Blank rows = not tested)  Repeated Movements Repeated cervical retraction: R UT tightness during, no worse after  Passive Accessory Intervertebral Motion Deferred   SPECIAL TESTS Spurlings A (ipsilateral lateral flexion/axial compression): R: Positive L: Negative Distraction Test: Positive  Hoffman Sign (cervical cord compression): R: Negative L: Negative  ULTT (03/22/23): Median nerve (+), Ulnar and radial nerve (-)    TODAY'S TREATMENT   SUBJECTIVE STATEMENT: Patent reports tolerating retraction-extension well  since last visit. She reports not completing self-traction as much - didn't need to as much. Patient reports R-sided discomfort at arrival, stiffness. Patient reports 3-4/10 pain at arrival.     Manual Therapy - for symptom modulation, soft tissue sensitivity and mobility, joint mobility, C-spine ROM   Manual cervical traction in supine; 10 sec on, 10 sec off; x 5 minutes for nerve root decompression and symptom modulation  STM/DTM R splenius cervicis/capitis/ R upper trapezius; x 15 minutes  SOR; x 2 minutes  ULTTs completed (see above)   Therapeutic Exercise - for improved soft tissue flexibility and extensibility as needed for ROM, repeated movement for symptom modulation and to improve overall ROM   AROM Cervical flexion:  WNL Cervical extension: min motion loss (no pain)  Lateral flexion: Right WNL* , Left WNL (pull on R side)  Cervical rotation: Right WNL, Left WNL (pull on R side)  *Indicates pain   Thoracic AROM: WNL with exception of moderate motion loss and low back pain with thoracic extension   Repeated cervical retraction-extension, seated;  reviewed Bruegger's posture endurance; x5, 10 sec hold Cervical sidebend stretch; 2 x 30 sec  PATIENT EDUCATION: Discussed continued HEP and progression to neurodynamics planned for next visit.    *not today* Repeated cervical retraction, supine; 1x10, 1 sec hold    PATIENT EDUCATION:  Education details: see above for patient education details Person educated: Patient Education method: Explanation, Demonstration, and Handouts Education comprehension: verbalized understanding and returned demonstration   HOME EXERCISE PROGRAM:  Access Code: [email or web address] URL: [email or web address] Date: 03/20/2023 Prepared by: Consuela Mimes  Exercises - Seated Cervical Retraction and Extension  - 5-6 x daily - 7 x weekly - 1 sets - 10 reps - 1sec hold - Seated Self Cervical Traction  - 7 x weekly - 1 sets - 10 reps - 5-10sec hold   ASSESSMENT:  CLINICAL IMPRESSION: Patient does report not having to use self-traction for pain control as much recently and reports doing well with current repeated movement program. She has minimal ROM deficits with only mild C-spine extension motion loss noted today. Pt does have some tightness along R paracervical musculature with rotation/sidebend to L side. Patient tolerates session well today, and we will continue with neurodynamics and further postural re-edu next visit. Patient has remaining deficits in C-spine AROM, R-sided neck pain and R upper limb paresthesias, postural changes, and positional intolerance for lying supine. Patient will benefit from continued skilled therapeutic intervention to address the above deficits as needed for improved function and QoL.    OBJECTIVE IMPAIRMENTS: decreased ROM, decreased strength, hypomobility, impaired UE functional use, postural dysfunction, and pain.   ACTIVITY LIMITATIONS: carrying, lifting, and sleeping  PARTICIPATION LIMITATIONS: occupation and crocheting  PERSONAL FACTORS: Past/current  experiences, Time since onset of injury/illness/exacerbation, and 3+ comorbidities: (seizure disorder, anxiety, depression)  are also affecting patient's functional outcome.   REHAB POTENTIAL: Good  CLINICAL DECISION MAKING: Evolving/moderate complexity  EVALUATION COMPLEXITY: Moderate   GOALS: Goals reviewed with patient? Yes  SHORT TERM GOALS: Target date: 04/05/2023  Pt will be independent with HEP to improve strength and decrease neck pain to improve pain-free function at home and work. Baseline: 03/15/23:  Goal status: INITIAL   LONG TERM GOALS: Target date: 04/27/2023  Pt will increase FOTO to at least 62 to demonstrate significant improvement in function at home and work related to neck pain  Baseline: 03/15/23: 48 Goal status: INITIAL  2.  Pt will decrease worst neck pain by at least 2  points on the NPRS in order to demonstrate clinically significant reduction in neck pain. Baseline: 03/15/23:  Goal status: INITIAL  3.  Pt will have full cervical spine AROM without reproduction of symptoms as needed for scanning environment and performing reaching and overhead activity Baseline: 03/15/23: Motion loss with flexion, extension, and R rotation; pain with flexion, bilat lateral flexion, R rotation.  Goal status: INITIAL  4.  Patient will sleep through the night and report no disturbance with position changes as needed for improved sleep quality and long-term health/wellness Baseline: 03/15/23: Difficulty with lying to R side and with lying on back, interference with sleep Goal status: INITIAL   PLAN: PT FREQUENCY: 2x/week  PT DURATION: 6 weeks  PLANNED INTERVENTIONS: Therapeutic exercises, Patient/Family education, Self Care, Joint mobilization, Joint manipulation, Dry Needling, Electrical stimulation, Spinal manipulation, Spinal mobilization, Cryotherapy, Moist heat, Taping, Traction, Manual therapy, and Re-evaluation.  PLAN FOR NEXT SESSION: Re-assess repeated movement. Use  cervical traction for symptom modulation. STM and C-spine mobilization.     Consuela Mimes, PT, DPT #[id number]  Gertie Exon, PT 03/22/2023, 11:24 AM

## 2023-03-27 ENCOUNTER — Ambulatory Visit: Payer: Medicaid Other | Admitting: Physical Therapy

## 2023-03-27 NOTE — Therapy (Deleted)
OUTPATIENT PHYSICAL THERAPY TREATMENT   Patient Name: Jill Woodard MRN: [id number] DOB:March 16, 2001, 22 y.o., female Today's Date: 03/27/2023  END OF SESSION:     Past Medical History:  Diagnosis Date   Allergy    Anxiety    Asthma    Depression with anxiety 01/03/2023   Epilepsy (HCC)    Herpes    Seizures (HCC)    Past Surgical History:  Procedure Laterality Date   ADENOIDECTOMY     BRAIN SURGERY     FINGER FRACTURE SURGERY     TYMPANOSTOMY TUBE PLACEMENT     Patient Active Problem List   Diagnosis Date Noted   Cervical radicular pain 02/14/2023   Depression with anxiety 01/03/2023   Right wrist tendinitis 01/03/2023   Focal seizures (HCC) 10/19/2022   Hx of migraines 12/07/2021   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 Ashley Royalty 04/19/23  Pertinent History 22 year old female with primary complaint of R-sided neck pain and R upper limb referred pain; she also has pain along radial aspect of R wrist. Per referring physician, pt reports chronic neck tightness and stiffness, right upper extremity shooting pain, intermittent paresthesias. Past med Hx including seizure disorder, anxiety, depression.   Patient reports getting out of hospital due to seizures last week. Patient reports having seizures some over last 2 days; today is better. Pt currently reports difficulty with turning her head to the R - more pain with forcing to R. She reports  notable stiffness and feeling of knots along sides of neck and back of neck. She reports pain in shoulders. She reports pain with sitting up straight. She reports Hx of severe scoliosis. Pt reports no change with use of topical Biofreeze or use of wrist brace. Pt has vagus nerve stimulator on her L side. Pt reports difficulty with lying on back and having to keep neck slightly to L.   Pain:  Pain Intensity: Described as stiffness presently versus pain  Best: 3/10 at best, Worst: 7/10 Pain location: Pain along R lower paracervical region and UT. Intermittent pain into ulnar aspect of wrist and toward 5th digit Pain Quality:  ]shooting into R wrist/ulnar side, throbbing/jolting and tightness close to neck/UT   Radiating: Yes , to RUE down to R wrist  Numbness/Tingling: Yes, down to R wrist  Focal Weakness: Yes; pt reports some difficulty with gripping and moving items, dropped bowl by accident one time. Sometimes pinky into 4th-5th digits, sometimes 1st-2nd digit. Seldom on L side. NT is usually on R side.  Aggravating factors: turning head to R, lying flat on back Relieving factors: lying to L side, Acetaminophen, Meloxicam 24-hour pain behavior: worse in AM upon waking, worse again at mid-afternoon  History of prior neck injury, pain, surgery, or therapy: No Dominant hand: right Imaging: Yes ;  02/14/23 Negative cervical spine radiographs.    Red flags (personal history of cancer, h/o spinal tumors, history of compression fracture, chills/fever, night sweats, nausea, vomiting, unrelenting pain): Chills/fever, nausea/vomiting and chills with seizure activity  PRECAUTIONS: Other: Seizure history   WEIGHT BEARING RESTRICTIONS: No  FALLS: Has patient fallen in last 6 months? 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   Hobbies: crotcheting, crafts  Patient Goals: Able to turn head better, improved stiffness, improved strength and ability to use R hand     OBJECTIVE:   Posture Forward head, rounded shoulder posture; inc thoracic kyphosis, mild L rib hump (Hx of scoliosis)   AROM AROM (Normal range in degrees) AROM 03/15/23  Cervical  Flexion (50) 46* (pull on R)  Extension (80) 46  Right lateral flexion (45) WNL*  Left lateral flexion (45) WNL*  Right rotation (85) 62*  Left rotation (85) 87 (mild pull on R)  (* = pain; Blank rows = not tested)  (03/22/23) Thoracic AROM: WNL with exception of moderate motion loss and low back pain with thoracic extension   Shoulder AROM: Flexion WNL bilat, abduction 130 deg R WFL L Wrist AROM: WNL, pain with flexion and ulnar deviation on R    MMT MMT (out of 5) Right 03/15/23 Left 03/15/23      Shoulder   Flexion 4+ 4+  Extension    Abduction 4+ 4+  Internal rotation 5 5  Horizontal abduction    Horizontal adduction    Lower Trapezius    Rhomboids        Elbow  Flexion 5 5  Extension 5 5  Pronation    Supination        Wrist  Flexion 5   Extension 5   Radial deviation 5   Ulnar deviation 5       (* = pain; Blank rows = not tested)  Sensation Grossly intact to light touch bilateral UE as determined by testing dermatomes C2-T2. Proprioception and hot/cold testing deferred on this date.  Reflexes R/L Elbow: 2+/2+  Brachioradialis: 2+/2+  Tricep: 2+/2+ [facility] signs: Hoffman's negative and Clonus negative (only 1-2 beats per foot)    Palpation Location LEFT  RIGHT           Suboccipitals 0 1 (mild)  Cervical paraspinals 0 1  Upper Trapezius  0  Levator Scapulae  1  Rhomboid Major/Minor  1  (Blank rows = not tested) Graded on 0-4 scale (0 = no pain, 1 = pain, 2 = pain with wincing/grimacing/flinching, 3 = pain with withdrawal, 4 = unwilling to allow palpation), (Blank rows = not  tested)  Repeated Movements Repeated cervical retraction: R UT tightness during, no worse after  Passive Accessory Intervertebral Motion Deferred   SPECIAL TESTS Spurlings A (ipsilateral lateral flexion/axial compression): R: Positive L: Negative Distraction Test: Positive  Hoffman Sign (cervical cord compression): R: Negative L: Negative  ULTT (03/22/23): Median nerve (+), Ulnar and radial nerve (-)    TODAY'S TREATMENT   SUBJECTIVE STATEMENT: Patent reports tolerating retraction-extension well since last visit. She reports not completing self-traction as much - didn't need to as much. Patient reports R-sided discomfort at arrival, stiffness. Patient reports 3-4/10 pain at arrival.     Manual Therapy - for symptom modulation, soft tissue sensitivity and mobility, joint mobility, C-spine ROM   Manual cervical traction in supine; 10 sec on, 10 sec off; x 5 minutes for nerve root decompression and symptom modulation  STM/DTM R splenius cervicis/capitis/ R upper trapezius; x 15 minutes  SOR; x 2 minutes  ULTTs completed (see above)   Therapeutic Exercise - for improved soft tissue flexibility and extensibility as needed for ROM, repeated movement for symptom modulation and to improve overall ROM   AROM Cervical flexion:  WNL Cervical extension: min motion loss (no pain)  Lateral flexion: Right WNL* , Left WNL (pull on R side)  Cervical rotation: Right WNL, Left WNL (pull on R side)  *Indicates pain   Thoracic AROM: WNL with exception of moderate motion loss and low back pain with thoracic extension   Repeated cervical retraction-extension, seated; reviewed Bruegger's posture endurance; x5, 10 sec hold Cervical sidebend stretch; 2 x 30 sec  PATIENT EDUCATION: Discussed continued HEP and progression to neurodynamics planned for next visit.    *not today* Repeated cervical retraction, supine; 1x10, 1 sec hold    PATIENT EDUCATION:  Education details: see above for  patient education details Person educated: Patient Education method: Explanation, Demonstration, and Handouts Education comprehension: verbalized understanding and returned demonstration   HOME EXERCISE PROGRAM:  Access Code: [email or web address] URL: [email or web address] Date: 03/20/2023 Prepared by: Consuela Mimes  Exercises - Seated Cervical Retraction and Extension  - 5-6 x daily - 7 x weekly - 1 sets - 10 reps - 1sec hold - Seated Self Cervical Traction  - 7 x weekly - 1 sets - 10 reps - 5-10sec hold   ASSESSMENT:  CLINICAL IMPRESSION: Patient does report not having to use self-traction for pain control as much recently and reports doing well with current repeated movement program. She has minimal ROM deficits with only mild C-spine extension motion loss noted today. Pt does have some tightness along R paracervical musculature with rotation/sidebend to L side. Patient tolerates session well today, and we will continue with neurodynamics and further postural re-edu next visit. Patient has remaining deficits in C-spine AROM, R-sided neck pain and R upper limb paresthesias, postural changes, and positional intolerance for lying supine. Patient will benefit from continued skilled therapeutic intervention to address the above deficits as needed for improved function and QoL.    OBJECTIVE IMPAIRMENTS: decreased ROM, decreased strength, hypomobility, impaired UE functional use, postural dysfunction, and pain.   ACTIVITY LIMITATIONS: carrying, lifting, and sleeping  PARTICIPATION LIMITATIONS: occupation and crocheting  PERSONAL FACTORS: Past/current experiences, Time since onset of injury/illness/exacerbation, and 3+ comorbidities: (seizure disorder, anxiety, depression)  are also affecting patient's functional outcome.   REHAB POTENTIAL: Good  CLINICAL DECISION MAKING: Evolving/moderate complexity  EVALUATION COMPLEXITY: Moderate   GOALS: Goals reviewed with patient?  Yes  SHORT TERM GOALS: Target date: 04/05/2023  Pt will be independent with HEP to improve strength and decrease neck pain to improve pain-free function at home and work. Baseline: 03/15/23:  Goal status: INITIAL   LONG TERM GOALS: Target date: 04/27/2023  Pt will increase FOTO to at least 62 to demonstrate significant improvement in function at home and work related to neck pain  Baseline: 03/15/23: 48 Goal status: INITIAL  2.  Pt will decrease worst neck pain by at least 2 points on the NPRS in order to demonstrate clinically significant reduction in neck pain. Baseline: 03/15/23:  Goal status: INITIAL  3.  Pt will have full cervical spine AROM without reproduction of symptoms as needed for scanning environment and performing reaching and overhead activity Baseline: 03/15/23: Motion loss with flexion, extension, and R rotation; pain with flexion, bilat lateral flexion, R rotation.  Goal status: INITIAL  4.  Patient will sleep through the night and report no disturbance with position changes  as needed for improved sleep quality and long-term health/wellness Baseline: 03/15/23: Difficulty with lying to R side and with lying on back, interference with sleep Goal status: INITIAL   PLAN: PT FREQUENCY: 2x/week  PT DURATION: 6 weeks  PLANNED INTERVENTIONS: Therapeutic exercises, Patient/Family education, Self Care, Joint mobilization, Joint manipulation, Dry Needling, Electrical stimulation, Spinal manipulation, Spinal mobilization, Cryotherapy, Moist heat, Taping, Traction, Manual therapy, and Re-evaluation.  PLAN FOR NEXT SESSION: Re-assess repeated movement. Use cervical traction for symptom modulation. STM and C-spine mobilization.     Consuela Mimes, PT, DPT #[id number]  Jill Woodard, PT 03/27/2023, 8:50 AM

## 2023-03-29 ENCOUNTER — Ambulatory Visit: Payer: Medicaid Other | Admitting: Physical Therapy

## 2023-04-03 ENCOUNTER — Ambulatory Visit: Payer: Medicaid Other | Admitting: Physical Therapy

## 2023-04-03 NOTE — Therapy (Deleted)
OUTPATIENT PHYSICAL THERAPY TREATMENT   Patient Name: Jill Woodard MRN: [id number] DOB:[birth year], 22 y.o., female Today's Date: 04/03/2023  END OF SESSION:     Past Medical History:  Diagnosis Date   Allergy    Anxiety    Asthma    Depression with anxiety 01/03/2023   Epilepsy (HCC)    Herpes    Seizures (HCC)    Past Surgical History:  Procedure Laterality Date   ADENOIDECTOMY     BRAIN SURGERY     FINGER FRACTURE SURGERY     TYMPANOSTOMY TUBE PLACEMENT     Patient Active Problem List   Diagnosis Date Noted   Cervical radicular pain 02/14/2023   Depression with anxiety 01/03/2023   Right wrist tendinitis 01/03/2023   Focal seizures (HCC) 10/19/2022   Hx of migraines 12/07/2021   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 Ashley Royalty 04/19/23  Pertinent History 22 year old female with primary complaint of R-sided neck pain and R upper limb referred pain; she also has pain along radial aspect of R wrist. Per referring physician, pt reports chronic neck tightness and stiffness, right upper extremity shooting pain, intermittent paresthesias. Past med Hx including seizure disorder, anxiety, depression.   Patient reports getting out of hospital due to seizures last week. Patient reports having seizures some over last 2 days; today is better. Pt currently reports difficulty with turning her head to the R - more pain with forcing to R. She reports  notable stiffness and feeling of knots along sides of neck and back of neck. She reports pain in shoulders. She reports pain with sitting up straight. She reports Hx of severe scoliosis. Pt reports no change with use of topical Biofreeze or use of wrist brace. Pt has vagus nerve stimulator on her L side. Pt reports difficulty with lying on back and having to keep neck slightly to L.   Pain:  Pain Intensity: Described as stiffness presently versus pain  Best: 3/10 at best, Worst: 7/10 Pain location: Pain along R lower paracervical region and UT. Intermittent pain into ulnar aspect of wrist and toward 5th digit Pain Quality:  ]shooting into R wrist/ulnar side, throbbing/jolting and tightness close to neck/UT   Radiating: Yes , to RUE down to R wrist  Numbness/Tingling: Yes, down to R wrist  Focal Weakness: Yes; pt reports some difficulty with gripping and moving items, dropped bowl by accident one time. Sometimes pinky into 4th-5th digits, sometimes 1st-2nd digit. Seldom on L side. NT is usually on R side.  Aggravating factors: turning head to R, lying flat on back Relieving factors: lying to L side, Acetaminophen, Meloxicam 24-hour pain behavior: worse in AM upon waking, worse again at mid-afternoon  History of prior neck injury, pain, surgery, or therapy: No Dominant hand: right Imaging: Yes ;  02/14/23 Negative cervical spine radiographs.    Red flags (personal history of cancer, h/o spinal tumors, history of compression fracture, chills/fever, night sweats, nausea, vomiting, unrelenting pain): Chills/fever, nausea/vomiting and chills with seizure activity  PRECAUTIONS: Other: Seizure history   WEIGHT BEARING RESTRICTIONS: No  FALLS: Has patient fallen in last 6 months? 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   Hobbies: crotcheting, crafts  Patient Goals: Able to turn head better, improved stiffness, improved strength and ability to use R hand     OBJECTIVE:   Posture Forward head, rounded shoulder posture; inc thoracic kyphosis, mild L rib hump (Hx of scoliosis)   AROM AROM (Normal range in degrees) AROM 03/15/23  Cervical  Flexion (50) 46* (pull on R)  Extension (80) 46  Right lateral flexion (45) WNL*  Left lateral flexion (45) WNL*  Right rotation (85) 62*  Left rotation (85) 87 (mild pull on R)  (* = pain; Blank rows = not tested)  (03/22/23) Thoracic AROM: WNL with exception of moderate motion loss and low back pain with thoracic extension   Shoulder AROM: Flexion WNL bilat, abduction 130 deg R WFL L Wrist AROM: WNL, pain with flexion and ulnar deviation on R    MMT MMT (out of 5) Right 03/15/23 Left 03/15/23      Shoulder   Flexion 4+ 4+  Extension    Abduction 4+ 4+  Internal rotation 5 5  Horizontal abduction    Horizontal adduction    Lower Trapezius    Rhomboids        Elbow  Flexion 5 5  Extension 5 5  Pronation    Supination        Wrist  Flexion 5   Extension 5   Radial deviation 5   Ulnar deviation 5       (* = pain; Blank rows = not tested)  Sensation Grossly intact to light touch bilateral UE as determined by testing dermatomes C2-T2. Proprioception and hot/cold testing deferred on this date.  Reflexes R/L Elbow: 2+/2+  Brachioradialis: 2+/2+  Tricep: 2+/2+ [facility] signs: Hoffman's negative and Clonus negative (only 1-2 beats per foot)    Palpation Location LEFT  RIGHT           Suboccipitals 0 1 (mild)  Cervical paraspinals 0 1  Upper Trapezius  0  Levator Scapulae  1  Rhomboid Major/Minor  1  (Blank rows = not tested) Graded on 0-4 scale (0 = no pain, 1 = pain, 2 = pain with wincing/grimacing/flinching, 3 = pain with withdrawal, 4 = unwilling to allow palpation), (Blank rows = not  tested)  Repeated Movements Repeated cervical retraction: R UT tightness during, no worse after  Passive Accessory Intervertebral Motion Deferred   SPECIAL TESTS Spurlings A (ipsilateral lateral flexion/axial compression): R: Positive L: Negative Distraction Test: Positive  Hoffman Sign (cervical cord compression): R: Negative L: Negative  ULTT (03/22/23): Median nerve (+), Ulnar and radial nerve (-)    TODAY'S TREATMENT   SUBJECTIVE STATEMENT: Patent reports tolerating retraction-extension well since last visit. She reports not completing self-traction as much - didn't need to as much. Patient reports R-sided discomfort at arrival, stiffness. Patient reports 3-4/10 pain at arrival.     Manual Therapy - for symptom modulation, soft tissue sensitivity and mobility, joint mobility, C-spine ROM   Manual cervical traction in supine; 10 sec on, 10 sec off; x 5 minutes for nerve root decompression and symptom modulation  STM/DTM R splenius cervicis/capitis/ R upper trapezius; x 15 minutes  SOR; x 2 minutes  ULTTs completed (see above)   Therapeutic Exercise - for improved soft tissue flexibility and extensibility as needed for ROM, repeated movement for symptom modulation and to improve overall ROM   AROM Cervical flexion:  WNL Cervical extension: min motion loss (no pain)  Lateral flexion: Right WNL* , Left WNL (pull on R side)  Cervical rotation: Right WNL, Left WNL (pull on R side)  *Indicates pain   Thoracic AROM: WNL with exception of moderate motion loss and low back pain with thoracic extension   Repeated cervical retraction-extension, seated; reviewed Bruegger's posture endurance; x5, 10 sec hold Cervical sidebend stretch; 2 x 30 sec  PATIENT EDUCATION: Discussed continued HEP and progression to neurodynamics planned for next visit.    *not today* Repeated cervical retraction, supine; 1x10, 1 sec hold    PATIENT EDUCATION:  Education details: see above for  patient education details Person educated: Patient Education method: Explanation, Demonstration, and Handouts Education comprehension: verbalized understanding and returned demonstration   HOME EXERCISE PROGRAM:  Access Code: [email or web address] URL: [email or web address] Date: 03/20/2023 Prepared by: Consuela Mimes  Exercises - Seated Cervical Retraction and Extension  - 5-6 x daily - 7 x weekly - 1 sets - 10 reps - 1sec hold - Seated Self Cervical Traction  - 7 x weekly - 1 sets - 10 reps - 5-10sec hold   ASSESSMENT:  CLINICAL IMPRESSION: Patient does report not having to use self-traction for pain control as much recently and reports doing well with current repeated movement program. She has minimal ROM deficits with only mild C-spine extension motion loss noted today. Pt does have some tightness along R paracervical musculature with rotation/sidebend to L side. Patient tolerates session well today, and we will continue with neurodynamics and further postural re-edu next visit. Patient has remaining deficits in C-spine AROM, R-sided neck pain and R upper limb paresthesias, postural changes, and positional intolerance for lying supine. Patient will benefit from continued skilled therapeutic intervention to address the above deficits as needed for improved function and QoL.    OBJECTIVE IMPAIRMENTS: decreased ROM, decreased strength, hypomobility, impaired UE functional use, postural dysfunction, and pain.   ACTIVITY LIMITATIONS: carrying, lifting, and sleeping  PARTICIPATION LIMITATIONS: occupation and crocheting  PERSONAL FACTORS: Past/current experiences, Time since onset of injury/illness/exacerbation, and 3+ comorbidities: (seizure disorder, anxiety, depression)  are also affecting patient's functional outcome.   REHAB POTENTIAL: Good  CLINICAL DECISION MAKING: Evolving/moderate complexity  EVALUATION COMPLEXITY: Moderate   GOALS: Goals reviewed with patient?  Yes  SHORT TERM GOALS: Target date: 04/05/2023  Pt will be independent with HEP to improve strength and decrease neck pain to improve pain-free function at home and work. Baseline: 03/15/23:  Goal status: INITIAL   LONG TERM GOALS: Target date: 04/27/2023  Pt will increase FOTO to at least 62 to demonstrate significant improvement in function at home and work related to neck pain  Baseline: 03/15/23: 48 Goal status: INITIAL  2.  Pt will decrease worst neck pain by at least 2 points on the NPRS in order to demonstrate clinically significant reduction in neck pain. Baseline: 03/15/23:  Goal status: INITIAL  3.  Pt will have full cervical spine AROM without reproduction of symptoms as needed for scanning environment and performing reaching and overhead activity Baseline: 03/15/23: Motion loss with flexion, extension, and R rotation; pain with flexion, bilat lateral flexion, R rotation.  Goal status: INITIAL  4.  Patient will sleep through the night and report no disturbance with position changes  as needed for improved sleep quality and long-term health/wellness Baseline: 03/15/23: Difficulty with lying to R side and with lying on back, interference with sleep Goal status: INITIAL   PLAN: PT FREQUENCY: 2x/week  PT DURATION: 6 weeks  PLANNED INTERVENTIONS: Therapeutic exercises, Patient/Family education, Self Care, Joint mobilization, Joint manipulation, Dry Needling, Electrical stimulation, Spinal manipulation, Spinal mobilization, Cryotherapy, Moist heat, Taping, Traction, Manual therapy, and Re-evaluation.  PLAN FOR NEXT SESSION: Re-assess repeated movement. Use cervical traction for symptom modulation. STM and C-spine mobilization.     Consuela Mimes, PT, DPT #[id number]  Gertie Exon, PT 04/03/2023, 7:48 AM

## 2023-04-05 ENCOUNTER — Encounter: Payer: Self-pay | Admitting: Family Medicine

## 2023-04-05 ENCOUNTER — Ambulatory Visit: Payer: Medicaid Other | Admitting: Physical Therapy

## 2023-04-05 DIAGNOSIS — R569 Unspecified convulsions: Secondary | ICD-10-CM | POA: Diagnosis not present

## 2023-04-06 ENCOUNTER — Other Ambulatory Visit: Payer: Self-pay | Admitting: Family Medicine

## 2023-04-06 ENCOUNTER — Telehealth: Payer: Medicaid Other | Admitting: Physician Assistant

## 2023-04-06 DIAGNOSIS — K120 Recurrent oral aphthae: Secondary | ICD-10-CM

## 2023-04-06 MED ORDER — ALBUTEROL SULFATE HFA 108 (90 BASE) MCG/ACT IN AERS: 1.0000 | INHALATION_SPRAY | Freq: Four times a day (QID) | RESPIRATORY_TRACT | 2 refills | Status: DC | PRN

## 2023-04-06 MED ORDER — NYSTATIN 100000 UNIT/ML MT SUSP: 5.0000 mL | Freq: Three times a day (TID) | OROMUCOSAL | 0 refills | Status: DC | PRN

## 2023-04-06 NOTE — Telephone Encounter (Signed)
Please advise 

## 2023-04-07 MED ORDER — CHLORHEXIDINE GLUCONATE 0.12 % MT SOLN: 15.0000 mL | Freq: Every day | OROMUCOSAL | 0 refills | Status: DC

## 2023-04-07 MED ORDER — TRIAMCINOLONE ACETONIDE 0.1 % MT PSTE
1.0000 | PASTE | Freq: Every day | OROMUCOSAL | 0 refills | Status: DC
Start: 2023-04-07 — End: 2024-02-28

## 2023-04-07 NOTE — Progress Notes (Signed)

## 2023-04-10 ENCOUNTER — Ambulatory Visit: Payer: Medicaid Other | Admitting: Physical Therapy

## 2023-04-10 ENCOUNTER — Encounter: Payer: Self-pay | Admitting: Physical Therapy

## 2023-04-10 DIAGNOSIS — M542 Cervicalgia: Secondary | ICD-10-CM | POA: Diagnosis not present

## 2023-04-10 DIAGNOSIS — M6281 Muscle weakness (generalized): Secondary | ICD-10-CM

## 2023-04-10 NOTE — Therapy (Signed)
OUTPATIENT PHYSICAL THERAPY TREATMENT   Patient Name: Jill Woodard MRN: [id number] DOB:February 15, 2001, 22 y.o., female Today's Date: 04/10/2023  END OF SESSION:  PT End of Session - 04/10/23 0823     Visit Number 4    Number of Visits 13    Date for PT Re-Evaluation 04/26/23    Authorization Type [place] Medicaid 2024    PT Start Time 0820    PT Stop Time 0900    PT Time Calculation (min) 40 min    Behavior During Therapy [facility] for tasks assessed/performed               Past Medical History:  Diagnosis Date   Allergy    Anxiety    Asthma    Depression with anxiety 01/03/2023   Epilepsy (HCC)    Herpes    Seizures (HCC)    Past Surgical History:  Procedure Laterality Date   ADENOIDECTOMY     BRAIN SURGERY     FINGER FRACTURE SURGERY     TYMPANOSTOMY TUBE PLACEMENT     Patient Active Problem List   Diagnosis Date Noted   Cervical radicular pain 02/14/2023   Depression with anxiety 01/03/2023   Right wrist tendinitis 01/03/2023   Focal seizures (HCC) 10/19/2022   Hx of migraines 12/07/2021   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  ONSET DATE: 03/14/2022 pain in R wrist, more pain in neck 3-4 months ago  FOLLOW-UP APPT SCHEDULED WITH REFERRING PROVIDER: Yes , next f/u with Dr. Ashley Royalty 04/19/23  Pertinent History 22 year old female with primary complaint of R-sided neck pain and R upper limb referred pain; she also has pain along radial aspect of R wrist. Per referring physician, pt reports chronic neck tightness and stiffness, right upper extremity shooting pain, intermittent paresthesias. Past med Hx  including seizure disorder, anxiety, depression.   Patient reports getting out of hospital due to seizures last week. Patient reports having seizures some over last 2 days; today is better. Pt currently reports difficulty with turning her head to the R - more pain with forcing to R. She reports notable stiffness and feeling of knots along sides of neck and back of neck. She reports pain in shoulders. She reports pain with sitting up straight. She reports Hx of severe scoliosis. Pt reports no change with use of topical Biofreeze or use of wrist brace. Pt has vagus nerve stimulator on her L side. Pt reports difficulty with lying on back and having to keep neck slightly to L.   Pain:  Pain Intensity: Described as stiffness presently versus pain  Best: 3/10 at best, Worst: 7/10 Pain location: Pain along R lower paracervical region and UT. Intermittent pain into ulnar aspect of wrist and toward 5th digit Pain Quality:  ]shooting into R wrist/ulnar side, throbbing/jolting and tightness close to neck/UT   Radiating: Yes , to RUE down to R wrist  Numbness/Tingling: Yes, down to R wrist  Focal Weakness: Yes; pt reports some difficulty with gripping and moving items, dropped bowl by accident one time. Sometimes pinky into 4th-5th digits, sometimes 1st-2nd digit. Seldom on L side. NT is usually on R side.  Aggravating factors: turning head to  R, lying flat on back Relieving factors: lying to L side, Acetaminophen, Meloxicam 24-hour pain behavior: worse in AM upon waking, worse again at mid-afternoon  History of prior neck injury, pain, surgery, or therapy: No Dominant hand: right Imaging: Yes ;  02/14/23 Negative cervical spine radiographs.    Red flags (personal history of cancer, h/o spinal tumors, history of compression fracture, chills/fever, night sweats, nausea, vomiting, unrelenting pain): Chills/fever, nausea/vomiting and chills with seizure activity    PRECAUTIONS: Other: Seizure history    WEIGHT BEARING RESTRICTIONS: No  FALLS: Has patient fallen in last 6 months? 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   Hobbies: crotcheting, crafts  Patient Goals: Able to turn head better, improved stiffness, improved strength and ability to use R hand     OBJECTIVE:   Posture Forward head, rounded shoulder posture; inc thoracic kyphosis, mild L rib hump (Hx of scoliosis)   AROM AROM (Normal range in degrees) AROM 03/15/23  Cervical  Flexion (50) 46* (pull on R)  Extension (80) 46  Right lateral flexion (45) WNL*  Left lateral flexion (45) WNL*  Right rotation (85) 62*  Left rotation (85) 87 (mild pull on R)  (* = pain; Blank rows = not tested)  (03/22/23) Thoracic AROM: WNL with exception of moderate motion loss and low back pain with thoracic extension   Shoulder AROM: Flexion WNL bilat, abduction 130 deg R WFL L Wrist AROM: WNL, pain with flexion and ulnar deviation on R    MMT MMT (out of 5) Right 03/15/23 Left 03/15/23      Shoulder   Flexion 4+ 4+  Extension    Abduction 4+ 4+  Internal rotation 5 5  Horizontal abduction    Horizontal adduction    Lower Trapezius    Rhomboids        Elbow  Flexion 5 5  Extension 5 5  Pronation    Supination        Wrist  Flexion 5   Extension 5   Radial deviation 5   Ulnar deviation 5       (* = pain; Blank rows = not tested)  Sensation Grossly intact to light touch bilateral UE as determined by testing dermatomes C2-T2. Proprioception and hot/cold testing deferred on this date.  Reflexes R/L Elbow: 2+/2+  Brachioradialis: 2+/2+  Tricep: 2+/2+ [facility] signs: Hoffman's negative and Clonus negative (only 1-2 beats per foot)    Palpation Location LEFT  RIGHT           Suboccipitals 0 1 (mild)   Cervical paraspinals 0 1  Upper Trapezius  0  Levator Scapulae  1  Rhomboid Major/Minor  1  (Blank rows = not tested) Graded on 0-4 scale (0 = no pain, 1 = pain, 2 = pain with wincing/grimacing/flinching, 3 = pain with withdrawal, 4 = unwilling to allow palpation), (Blank rows = not tested)  Repeated Movements Repeated cervical retraction: R UT tightness during, no worse after  Passive Accessory Intervertebral Motion Deferred   SPECIAL TESTS Spurlings A (ipsilateral lateral flexion/axial compression): R: Positive L: Negative Distraction Test: Positive  Hoffman Sign (cervical cord compression): R: Negative L: Negative  ULTT (03/22/23): Median nerve (+), Ulnar and radial nerve (-)    TODAY'S TREATMENT   SUBJECTIVE STATEMENT: Patent reports slight HA at arrival  associated with toothache. Pt is awaiting root canal for this issue. Patient reports tolerating repeated motion relatively well. Pt had neurology follow-up. Pt is awaiting further scheduling with neurosurgery for replacing battery in VNS. Pt fortunately has not experienced seizure in last 3 weeks with updated Rx.     Manual Therapy - for symptom modulation, soft tissue sensitivity and mobility, joint mobility, C-spine ROM   Manual cervical traction in supine; 10 sec on, 10 sec off; x 5 minutes for nerve root decompression and symptom modulation  STM/DTM R splenius cervicis/capitis/ R upper trapezius; x 10 minutes  SOR and upper cervical distraction; x 5 minutes  Cervical spine sideglides, R to L at C3-5 levels; 3 x 30 sec   Therapeutic Exercise - for improved soft tissue flexibility and extensibility as needed for ROM, repeated movement for symptom modulation and to improve overall ROM   AROM Cervical flexion:  WNL Cervical extension: WNL* (pull SCM and discomfort along mid to upper C-spine) Lateral flexion: Right WNL , Left WNL Cervical rotation: Right WNL, Left WNL* (pain/shooting along R upper C-spine  paraspinals) *Indicates pain   Thoracic AROM: WNL with exception of moderate motion loss and low back pain with thoracic extension   Repeated cervical retraction-extension, seated; reviewed  Cervical sidebend stretch; 2 x 30 sec  -R sidebend sustained: pt felt numbness/pain to R thumb, stopped R sidebend stretch  -only completed L sidebend with 2nd set  Bruegger's posture endurance; x10, 10 sec hold  Median nerve flossing; 1x10  -added to HEP  PATIENT EDUCATION: Updated HEP to include neurodynamics. We discussed holding on stretching if experiencing peripheralizing/upper limb symptoms.    *not today* Repeated cervical retraction, supine; 1x10, 1 sec hold    PATIENT EDUCATION:  Education details: see above for patient education details Person educated: Patient Education method: Explanation, Demonstration, and Handouts Education comprehension: verbalized understanding and returned demonstration   HOME EXERCISE PROGRAM:  Access Code: [email or web address] URL: [email or web address] Date: 03/20/2023 Prepared by: Consuela Mimes  Exercises - Seated Cervical Retraction and Extension  - 5-6 x daily - 7 x weekly - 1 sets - 10 reps - 1sec hold - Seated Self Cervical Traction  - 7 x weekly - 1 sets - 10 reps - 5-10sec hold   ASSESSMENT:  CLINICAL IMPRESSION: Patient demonstrates largely normal cervical spine AROM, though she has some discomfort in upper C-spine (more on right side) with extension and L rotation. Upper cervical pain with ROM is improved following manual therapy. Pt reports remaining tightness along SCM/scelenes; pt may benefit from additional manual work and stretching for these groups. Pain is fortunately low at arrival. Pt's program was updated to include neurodynamics/flossing for median nerve. Patient has remaining deficits in C-spine AROM, R-sided neck pain and R upper limb paresthesias, postural changes, and positional intolerance for lying supine. Patient  will benefit from continued skilled therapeutic intervention to address the above deficits as needed for improved function and QoL.    OBJECTIVE IMPAIRMENTS: decreased ROM, decreased strength, hypomobility, impaired UE functional use, postural dysfunction, and pain.   ACTIVITY LIMITATIONS: carrying, lifting, and sleeping  PARTICIPATION LIMITATIONS: occupation and crocheting  PERSONAL FACTORS: Past/current experiences, Time since onset of injury/illness/exacerbation, and 3+ comorbidities: (seizure disorder, anxiety, depression)  are also affecting patient's functional outcome.   REHAB POTENTIAL: Good  CLINICAL DECISION MAKING: Evolving/moderate complexity  EVALUATION COMPLEXITY: Moderate   GOALS: Goals reviewed with patient? Yes  SHORT TERM GOALS: Target date: 04/05/2023  Pt will be independent with HEP to improve strength and  decrease neck pain to improve pain-free function at home and work. Baseline: 03/15/23:  Goal status: INITIAL   LONG TERM GOALS: Target date: 04/27/2023  Pt will increase FOTO to at least 62 to demonstrate significant improvement in function at home and work related to neck pain  Baseline: 03/15/23: 48 Goal status: INITIAL  2.  Pt will decrease worst neck pain by at least 2 points on the NPRS in order to demonstrate clinically significant reduction in neck pain. Baseline: 03/15/23:  Goal status: INITIAL  3.  Pt will have full cervical spine AROM without reproduction of symptoms as needed for scanning environment and performing reaching and overhead activity Baseline: 03/15/23: Motion loss with flexion, extension, and R rotation; pain with flexion, bilat lateral flexion, R rotation.  Goal status: INITIAL  4.  Patient will sleep through the night and report no disturbance with position changes as needed for improved sleep quality and long-term health/wellness Baseline: 03/15/23: Difficulty with lying to R side and with lying on back, interference with  sleep Goal status: INITIAL   PLAN: PT FREQUENCY: 2x/week  PT DURATION: 6 weeks  PLANNED INTERVENTIONS: Therapeutic exercises, Patient/Family education, Self Care, Joint mobilization, Joint manipulation, Dry Needling, Electrical stimulation, Spinal manipulation, Spinal mobilization, Cryotherapy, Moist heat, Taping, Traction, Manual therapy, and Re-evaluation.  PLAN FOR NEXT SESSION: Re-assess repeated movement. Use cervical traction for symptom modulation. STM and C-spine mobilization.     Consuela Mimes, PT, DPT #[id number]  Gertie Exon, PT 04/10/2023, 8:23 AM

## 2023-04-12 ENCOUNTER — Encounter: Payer: Self-pay | Admitting: Physical Therapy

## 2023-04-12 ENCOUNTER — Ambulatory Visit: Payer: Medicaid Other | Admitting: Physical Therapy

## 2023-04-12 DIAGNOSIS — M6281 Muscle weakness (generalized): Secondary | ICD-10-CM | POA: Diagnosis not present

## 2023-04-12 DIAGNOSIS — M542 Cervicalgia: Secondary | ICD-10-CM | POA: Diagnosis not present

## 2023-04-12 NOTE — Therapy (Signed)
OUTPATIENT PHYSICAL THERAPY TREATMENT   Patient Name: Jill Woodard MRN: [id number] DOB:07-28-2001, 22 y.o., female Today's Date: 04/12/2023  END OF SESSION:  PT End of Session - 04/12/23 1058     Visit Number 5    Number of Visits 13    Date for PT Re-Evaluation 04/26/23    Authorization Type [place] Medicaid 2024    PT Start Time 1032    PT Stop Time 1110    PT Time Calculation (min) 38 min    Behavior During Therapy [facility] for tasks assessed/performed                Past Medical History:  Diagnosis Date   Allergy    Anxiety    Asthma    Depression with anxiety 01/03/2023   Epilepsy (HCC)    Herpes    Seizures (HCC)    Past Surgical History:  Procedure Laterality Date   ADENOIDECTOMY     BRAIN SURGERY     FINGER FRACTURE SURGERY     TYMPANOSTOMY TUBE PLACEMENT     Patient Active Problem List   Diagnosis Date Noted   Cervical radicular pain 02/14/2023   Depression with anxiety 01/03/2023   Right wrist tendinitis 01/03/2023   Focal seizures (HCC) 10/19/2022   Hx of migraines 12/07/2021   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 Ashley Royalty 04/19/23  Pertinent History 22 year old female with primary complaint of R-sided neck pain and R upper limb referred pain; she also has pain along radial aspect of R wrist. Per referring physician, pt reports chronic neck tightness and stiffness, right upper extremity shooting pain, intermittent  paresthesias. Past med Hx including seizure disorder, anxiety, depression.   Patient reports getting out of hospital due to seizures last week. Patient reports having seizures some over last 2 days; today is better. Pt currently reports difficulty with turning her head to the R - more pain with forcing to R. She reports notable stiffness and feeling of knots along sides of neck and back of neck. She reports pain in shoulders. She reports pain with sitting up straight. She reports Hx of severe scoliosis. Pt reports no change with use of topical Biofreeze or use of wrist brace. Pt has vagus nerve stimulator on her L side. Pt reports difficulty with lying on back and having to keep neck slightly to L.   Pain:  Pain Intensity: Described as stiffness presently versus pain  Best: 3/10 at best, Worst: 7/10 Pain location: Pain along R lower paracervical region and UT. Intermittent pain into ulnar aspect of wrist and toward 5th digit Pain Quality:  ]shooting into R wrist/ulnar side, throbbing/jolting and tightness close to neck/UT   Radiating: Yes , to RUE down to R wrist  Numbness/Tingling: Yes, down to R wrist  Focal Weakness: Yes; pt reports some difficulty with gripping and moving items, dropped bowl by accident one time. Sometimes pinky into 4th-5th digits, sometimes 1st-2nd digit. Seldom on L side. NT is usually on R side.  Aggravating factors:  turning head to R, lying flat on back Relieving factors: lying to L side, Acetaminophen, Meloxicam 24-hour pain behavior: worse in AM upon waking, worse again at mid-afternoon  History of prior neck injury, pain, surgery, or therapy: No Dominant hand: right Imaging: Yes ;  02/14/23 Negative cervical spine radiographs.    Red flags (personal history of cancer, h/o spinal tumors, history of compression fracture, chills/fever, night sweats, nausea, vomiting, unrelenting pain): Chills/fever, nausea/vomiting and chills with seizure activity    PRECAUTIONS:  Other: Seizure history   WEIGHT BEARING RESTRICTIONS: No  FALLS: Has patient fallen in last 6 months? 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   Hobbies: crotcheting, crafts  Patient Goals: Able to turn head better, improved stiffness, improved strength and ability to use R hand     OBJECTIVE:   Posture Forward head, rounded shoulder posture; inc thoracic kyphosis, mild L rib hump (Hx of scoliosis)   AROM AROM (Normal range in degrees) AROM 03/15/23  Cervical  Flexion (50) 46* (pull on R)  Extension (80) 46  Right lateral flexion (45) WNL*  Left lateral flexion (45) WNL*  Right rotation (85) 62*  Left rotation (85) 87 (mild pull on R)  (* = pain; Blank rows = not tested)  (03/22/23) Thoracic AROM: WNL with exception of moderate motion loss and low back pain with thoracic extension   Shoulder AROM: Flexion WNL bilat, abduction 130 deg R WFL L Wrist AROM: WNL, pain with flexion and ulnar deviation on R    MMT MMT (out of 5) Right 03/15/23 Left 03/15/23      Shoulder   Flexion 4+ 4+  Extension    Abduction 4+ 4+  Internal rotation 5 5  Horizontal abduction    Horizontal adduction    Lower Trapezius    Rhomboids        Elbow  Flexion 5 5  Extension 5 5  Pronation    Supination        Wrist  Flexion 5   Extension 5   Radial deviation 5   Ulnar deviation 5       (* = pain; Blank rows = not tested)  Sensation Grossly intact to light touch bilateral UE as determined by testing dermatomes C2-T2. Proprioception and hot/cold testing deferred on this date.  Reflexes R/L Elbow: 2+/2+  Brachioradialis: 2+/2+  Tricep: 2+/2+ [facility] signs: Hoffman's negative and Clonus negative (only 1-2 beats per foot)    Palpation Location LEFT  RIGHT            Suboccipitals 0 1 (mild)  Cervical paraspinals 0 1  Upper Trapezius  0  Levator Scapulae  1  Rhomboid Major/Minor  1  (Blank rows = not tested) Graded on 0-4 scale (0 = no pain, 1 = pain, 2 = pain with wincing/grimacing/flinching, 3 = pain with withdrawal, 4 = unwilling to allow palpation), (Blank rows = not tested)  Repeated Movements Repeated cervical retraction: R UT tightness during, no worse after  Passive Accessory Intervertebral Motion Deferred   SPECIAL TESTS Spurlings A (ipsilateral lateral flexion/axial compression): R: Positive L: Negative Distraction Test: Positive  Hoffman Sign (cervical cord compression): R: Negative L: Negative  ULTT (03/22/23): Median nerve (+), Ulnar and radial nerve (-)    TODAY'S TREATMENT   SUBJECTIVE STATEMENT: Patent reports notable  HA earlier today that she attributes to heat. She had teeth extracted this morning and has gauze in R side of mouth for this. Patient reports not having major neck pain or shoulder/arm pain at arrival.     Manual Therapy - for symptom modulation, soft tissue sensitivity and mobility, joint mobility, C-spine ROM    STM/DTM R splenius cervicis/capitis/ R upper trapezius; x 12 minutes  SOR and upper cervical distraction; x 8 minutes  Cervical spine sideglides, R to L and L to R at C4-6 levels; 2 x 30 sec ea dir  *not today* Manual cervical traction in supine; 10 sec on, 10 sec off; x 5 minutes for nerve root decompression and symptom modulation   Therapeutic Exercise - for improved soft tissue flexibility and extensibility as needed for ROM, repeated movement for symptom modulation and to improve overall ROM   AROM Cervical flexion:  WNL Cervical extension: WNL (mild discomfort posterior C-spine, "not much") Lateral flexion: Right WNL , Left WNL Cervical rotation: Right WNL, Left WNL *Indicates pain   Repeated cervical retraction-extension, seated; reviewed   Bruegger's posture endurance; x10, 10  sec hold, with Red Tband   Median nerve flossing; 1x10  -for HEP review; pt demonstrates sound technique with minimal verbal cueing  Standing postural row; 2x10, 5 sec hold; with Blue Tband   PATIENT EDUCATION: We reviewed HEP/repeated retraction-extension as primary repeated movement. We discussed avoidance of positions that peripheralize symptoms.     *not today* -R sidebend sustained: pt felt numbness/pain to R thumb, stopped R sidebend stretch  -only completed L sidebend with 2nd set Repeated cervical retraction, supine; 1x10, 1 sec hold  Cervical sidebend stretch; 2 x 30 sec     PATIENT EDUCATION:  Education details: see above for patient education details Person educated: Patient Education method: Explanation, Demonstration, and Handouts Education comprehension: verbalized understanding and returned demonstration   HOME EXERCISE PROGRAM:  Access Code: [email or web address] URL: [email or web address] Date: 04/12/2023 Prepared by: Consuela Mimes  Exercises - Seated Cervical Retraction and Extension  - 5-6 x daily - 7 x weekly - 1 sets - 10 reps - 1sec hold - Seated Self Cervical Traction  - 7 x weekly - 1 sets - 10 reps - 5-10sec hold - Median Nerve Flossing - Tray  - 2 x daily - 7 x weekly - 2 sets - 10 reps - 1 sec hold   ASSESSMENT:  CLINICAL IMPRESSION: Patient demonstrates normal C-spine AROM with minimal symptom reproduction today. Pt did have teeth extracted (R molars) and has experienced some toothache/headache related to this as expected. Pt reports notable occipital and frontal HA this AM. Pt tolerates SOR well today with mild sensitivity of suboccipital mm. Pt fortunately has not experienced recent upper limb paresthesias. No notable neck pain this AM. Patient has remaining deficits in C-spine AROM, intermittent R-sided neck pain and R upper limb paresthesias, postural changes, and positional intolerance for lying supine. Patient will benefit from continued  skilled therapeutic intervention to address the above deficits as needed for improved function and QoL.    OBJECTIVE IMPAIRMENTS: decreased ROM, decreased strength, hypomobility, impaired UE functional use, postural dysfunction, and pain.   ACTIVITY LIMITATIONS: carrying, lifting, and sleeping  PARTICIPATION LIMITATIONS: occupation and crocheting  PERSONAL FACTORS: Past/current experiences, Time since onset of injury/illness/exacerbation, and 3+ comorbidities: (seizure disorder, anxiety, depression)  are also affecting patient's functional outcome.   REHAB POTENTIAL: Good  CLINICAL DECISION MAKING: Evolving/moderate complexity  EVALUATION COMPLEXITY: Moderate   GOALS: Goals reviewed with patient? Yes  SHORT TERM GOALS: Target date: 04/05/2023  Pt will be independent with HEP to improve strength and decrease neck pain to improve pain-free function at home and work. Baseline: 03/15/23:  Goal status: INITIAL   LONG TERM GOALS: Target date: 04/27/2023  Pt will increase FOTO to at least 62 to demonstrate significant improvement in function at home and work related to neck pain  Baseline: 03/15/23: 48 Goal status: INITIAL  2.  Pt will decrease worst neck pain by at least 2 points on the NPRS in order to demonstrate clinically significant reduction in neck pain. Baseline: 03/15/23:  Goal status: INITIAL  3.  Pt will have full cervical spine AROM without reproduction of symptoms as needed for scanning environment and performing reaching and overhead activity Baseline: 03/15/23: Motion loss with flexion, extension, and R rotation; pain with flexion, bilat lateral flexion, R rotation.  Goal status: INITIAL  4.  Patient will sleep through the night and report no disturbance with position changes as needed for improved sleep quality and long-term health/wellness Baseline: 03/15/23: Difficulty with lying to R side and with lying on back, interference with sleep Goal status:  INITIAL   PLAN: PT FREQUENCY: 2x/week  PT DURATION: 6 weeks  PLANNED INTERVENTIONS: Therapeutic exercises, Patient/Family education, Self Care, Joint mobilization, Joint manipulation, Dry Needling, Electrical stimulation, Spinal manipulation, Spinal mobilization, Cryotherapy, Moist heat, Taping, Traction, Manual therapy, and Re-evaluation.  PLAN FOR NEXT SESSION: Re-assess repeated movement. Use cervical traction for symptom modulation. STM and C-spine mobilization.     Consuela Mimes, PT, DPT #[id number]  Gertie Exon, PT 04/12/2023, 11:14 AM

## 2023-04-16 DIAGNOSIS — R011 Cardiac murmur, unspecified: Secondary | ICD-10-CM

## 2023-04-16 HISTORY — DX: Cardiac murmur, unspecified: R01.1

## 2023-04-16 HISTORY — PX: VAGUS NERVE STIMULATOR GENERATOR CHANGE: SHX6854

## 2023-04-19 ENCOUNTER — Ambulatory Visit: Payer: Medicaid Other | Attending: Family Medicine | Admitting: Physical Therapy

## 2023-04-19 ENCOUNTER — Encounter: Payer: Self-pay | Admitting: Physical Therapy

## 2023-04-19 ENCOUNTER — Encounter: Payer: Self-pay | Admitting: Family Medicine

## 2023-04-19 ENCOUNTER — Ambulatory Visit: Payer: Medicaid Other | Admitting: Family Medicine

## 2023-04-19 VITALS — BP 126/78 | HR 100 | Ht 67.0 in | Wt 174.0 lb

## 2023-04-19 DIAGNOSIS — Z113 Encounter for screening for infections with a predominantly sexual mode of transmission: Secondary | ICD-10-CM | POA: Diagnosis not present

## 2023-04-19 DIAGNOSIS — J45909 Unspecified asthma, uncomplicated: Secondary | ICD-10-CM | POA: Insufficient documentation

## 2023-04-19 DIAGNOSIS — Z1159 Encounter for screening for other viral diseases: Secondary | ICD-10-CM

## 2023-04-19 DIAGNOSIS — M5412 Radiculopathy, cervical region: Secondary | ICD-10-CM | POA: Diagnosis not present

## 2023-04-19 DIAGNOSIS — E559 Vitamin D deficiency, unspecified: Secondary | ICD-10-CM | POA: Diagnosis not present

## 2023-04-19 DIAGNOSIS — Z0000 Encounter for general adult medical examination without abnormal findings: Secondary | ICD-10-CM

## 2023-04-19 DIAGNOSIS — R000 Tachycardia, unspecified: Secondary | ICD-10-CM

## 2023-04-19 DIAGNOSIS — Z13220 Encounter for screening for lipoid disorders: Secondary | ICD-10-CM

## 2023-04-19 DIAGNOSIS — K2100 Gastro-esophageal reflux disease with esophagitis, without bleeding: Secondary | ICD-10-CM

## 2023-04-19 DIAGNOSIS — G40909 Epilepsy, unspecified, not intractable, without status epilepticus: Secondary | ICD-10-CM | POA: Diagnosis not present

## 2023-04-19 DIAGNOSIS — Z114 Encounter for screening for human immunodeficiency virus [HIV]: Secondary | ICD-10-CM | POA: Diagnosis not present

## 2023-04-19 DIAGNOSIS — E010 Iodine-deficiency related diffuse (endemic) goiter: Secondary | ICD-10-CM | POA: Diagnosis not present

## 2023-04-19 DIAGNOSIS — M6281 Muscle weakness (generalized): Secondary | ICD-10-CM | POA: Diagnosis not present

## 2023-04-19 DIAGNOSIS — E041 Nontoxic single thyroid nodule: Secondary | ICD-10-CM | POA: Insufficient documentation

## 2023-04-19 DIAGNOSIS — M542 Cervicalgia: Secondary | ICD-10-CM | POA: Diagnosis not present

## 2023-04-19 HISTORY — DX: Encounter for general adult medical examination without abnormal findings: Z00.00

## 2023-04-19 HISTORY — DX: Iodine-deficiency related diffuse (endemic) goiter: E01.0

## 2023-04-19 MED ORDER — PANTOPRAZOLE SODIUM 40 MG PO TBEC
40.0000 mg | DELAYED_RELEASE_TABLET | Freq: Every day | ORAL | 3 refills | Status: DC | PRN
Start: 2023-04-19 — End: 2023-07-24

## 2023-04-19 NOTE — Therapy (Signed)
OUTPATIENT PHYSICAL THERAPY TREATMENT   Patient Name: Jill Woodard MRN: [id number] DOB:10-20-2000, 22 y.o., female Today's Date: 04/19/2023  END OF SESSION:  PT End of Session - 04/19/23 1031     Visit Number 6    Number of Visits 13    Date for PT Re-Evaluation 04/26/23    Authorization Type [place] Medicaid 2024    PT Start Time 1032    PT Stop Time 1112    PT Time Calculation (min) 40 min    Behavior During Therapy [facility] for tasks assessed/performed              Past Medical History:  Diagnosis Date   Allergy    Anxiety    Asthma    Depression with anxiety 01/03/2023   Epilepsy (HCC)    Healthcare maintenance 04/19/2023   Herpes    Seizures (HCC)    Thyromegaly 04/19/2023   Past Surgical History:  Procedure Laterality Date   ADENOIDECTOMY     BRAIN SURGERY     FINGER FRACTURE SURGERY     TYMPANOSTOMY TUBE PLACEMENT     Patient Active Problem List   Diagnosis Date Noted   Asthma 04/19/2023   Tachycardia 04/19/2023   Healthcare maintenance 04/19/2023   Thyromegaly 04/19/2023   Cervical radicular pain 02/14/2023   Depression with anxiety 01/03/2023   Right wrist tendinitis 01/03/2023   Focal seizures (HCC) 10/19/2022   Hx of migraines 12/07/2021   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 Ashley Royalty 04/19/23  Pertinent History 22 year old female with primary complaint of R-sided neck pain and R upper limb referred pain; she also has  pain along radial aspect of R wrist. Per referring physician, pt reports chronic neck tightness and stiffness, right upper extremity shooting pain, intermittent paresthesias. Past med Hx including seizure disorder, anxiety, depression.   Patient reports getting out of hospital due to seizures last week. Patient reports having seizures some over last 2 days; today is better. Pt currently reports difficulty with turning her head to the R - more pain with forcing to R. She reports notable stiffness and feeling of knots along sides of neck and back of neck. She reports pain in shoulders. She reports pain with sitting up straight. She reports Hx of severe scoliosis. Pt reports no change with use of topical Biofreeze or use of wrist brace. Pt has vagus nerve stimulator on her L side. Pt reports difficulty with lying on back and having to keep neck slightly to L.   Pain:  Pain Intensity: Described as stiffness presently versus pain  Best: 3/10 at best, Worst: 7/10 Pain location: Pain along R lower paracervical region and UT. Intermittent pain into ulnar aspect of wrist and toward 5th digit Pain Quality:  ]shooting into R wrist/ulnar side, throbbing/jolting and tightness close to neck/UT   Radiating: Yes , to RUE down to R wrist  Numbness/Tingling: Yes, down to R wrist  Focal Weakness: Yes; pt reports some difficulty with gripping and moving items, dropped bowl by  accident one time. Sometimes pinky into 4th-5th digits, sometimes 1st-2nd digit. Seldom on L side. NT is usually on R side.  Aggravating factors: turning head to R, lying flat on back Relieving factors: lying to L side, Acetaminophen, Meloxicam 24-hour pain behavior: worse in AM upon waking, worse again at mid-afternoon  History of prior neck injury, pain, surgery, or therapy: No Dominant hand: right Imaging: Yes ;  02/14/23 Negative cervical spine radiographs.    Red flags (personal history of cancer, h/o spinal tumors, history of  compression fracture, chills/fever, night sweats, nausea, vomiting, unrelenting pain): Chills/fever, nausea/vomiting and chills with seizure activity    PRECAUTIONS: Other: Seizure history   WEIGHT BEARING RESTRICTIONS: No  FALLS: Has patient fallen in last 6 months? 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   Hobbies: crotcheting, crafts  Patient Goals: Able to turn head better, improved stiffness, improved strength and ability to use R hand     OBJECTIVE:   Posture Forward head, rounded shoulder posture; inc thoracic kyphosis, mild L rib hump (Hx of scoliosis)   AROM AROM (Normal range in degrees) AROM 03/15/23  Cervical  Flexion (50) 46* (pull on R)  Extension (80) 46  Right lateral flexion (45) WNL*  Left lateral flexion (45) WNL*  Right rotation (85) 62*  Left rotation (85) 87 (mild pull on R)  (* = pain; Blank rows = not tested)  (03/22/23) Thoracic AROM: WNL with exception of moderate motion loss and low back pain with thoracic extension   Shoulder AROM: Flexion WNL bilat, abduction 130 deg R WFL L Wrist AROM: WNL, pain with flexion and ulnar deviation on R    MMT MMT (out of 5) Right 03/15/23 Left 03/15/23      Shoulder   Flexion 4+ 4+  Extension    Abduction 4+ 4+  Internal rotation 5 5  Horizontal abduction    Horizontal adduction    Lower Trapezius    Rhomboids        Elbow  Flexion 5 5  Extension 5 5  Pronation    Supination        Wrist  Flexion 5   Extension 5   Radial deviation 5   Ulnar deviation 5       (* = pain; Blank rows = not tested)  Sensation Grossly intact to light touch bilateral UE as determined by testing dermatomes C2-T2. Proprioception and hot/cold testing deferred on this date.  Reflexes R/L Elbow: 2+/2+   Brachioradialis: 2+/2+  Tricep: 2+/2+ UMN signs: Hoffman's negative and Clonus negative (only 1-2 beats per foot)    Palpation Location LEFT  RIGHT           Suboccipitals 0 1 (mild)  Cervical paraspinals 0 1  Upper Trapezius  0  Levator Scapulae  1  Rhomboid Major/Minor  1  (Blank rows = not tested) Graded on 0-4 scale (0 = no pain, 1 = pain, 2 = pain with wincing/grimacing/flinching, 3 = pain with withdrawal, 4 = unwilling to allow palpation), (Blank rows = not tested)  Repeated Movements Repeated cervical retraction: R UT tightness during, no worse after  Passive Accessory Intervertebral Motion Deferred   SPECIAL TESTS Spurlings A (ipsilateral lateral flexion/axial compression): R: Positive L: Negative Distraction Test: Positive  Hoffman Sign (cervical cord compression): R: Negative L:  Negative  ULTT (03/22/23): Median nerve (+), Ulnar and radial nerve (-)      TODAY'S TREATMENT   SUBJECTIVE STATEMENT: Patent reports having significant pain along L upper cervical/subcoccipital region on Sunday that led to headache around her L eye. She states that sharp pain is still there, though HA is gone. Patient reports refraining from sidebend stretching to R side due to this symptom. Pt is following up with cardiology regarding arrhythmia and intermittent tachycardia sometimes with and without palpitations.   HR: 100 bpm at rest    Manual Therapy - for symptom modulation, soft tissue sensitivity and mobility, joint mobility, C-spine ROM    STM/DTM R splenius cervicis/capitis/ R upper trapezius; x 12 minutes  SOR and upper cervical distraction; x 8 minutes  Cervical spine sideglides, R to L and L to R at C4-6 levels; 2 x 30 sec ea dir   *not today* Manual cervical traction in supine; 10 sec on, 10 sec off; x 5 minutes for nerve root decompression and symptom modulation   Therapeutic Exercise - for improved soft tissue flexibility and extensibility as needed for ROM,  repeated movement for symptom modulation and to improve overall ROM   AROM Cervical flexion:  WNL Cervical extension: WNL Lateral flexion: Right WNL , Left WNL Cervical rotation: Right WNL, Left WNL -No pain with AROM today   Bruegger's posture endurance; x10, 10 sec hold, with Green Tband   Standing postural row; 2x10, 5 sec hold; with Black Tband   PATIENT EDUCATION: We reviewed HEP and encouraged continued home program; pt instructed to f/u with PT if still experiencing notable HA symptoms.    *not today* Median nerve flossing; 1x10  -for HEP review; pt demonstrates sound technique with minimal verbal cueing -R sidebend sustained: pt felt numbness/pain to R thumb, stopped R sidebend stretch  -only completed L sidebend with 2nd set Repeated cervical retraction, supine; 1x10, 1 sec hold  Cervical sidebend stretch; 2 x 30 sec     PATIENT EDUCATION:  Education details: see above for patient education details Person educated: Patient Education method: Explanation, Demonstration, and Handouts Education comprehension: verbalized understanding and returned demonstration   HOME EXERCISE PROGRAM:  Access Code: [email or web address] URL: [email or web address] Date: 04/12/2023 Prepared by: Consuela Mimes  Exercises - Seated Cervical Retraction and Extension  - 5-6 x daily - 7 x weekly - 1 sets - 10 reps - 1sec hold - Seated Self Cervical Traction  - 7 x weekly - 1 sets - 10 reps - 5-10sec hold - Median Nerve Flossing - Tray  - 2 x daily - 7 x weekly - 2 sets - 10 reps - 1 sec hold   ASSESSMENT:  CLINICAL IMPRESSION: Patient fortunately has normal cervical spine AROM without reproduction of symptoms. She has experienced recent occipital headache with referral to frontal/peri-orbital region. Pt responds well with manual therapy today and is able to continue with low volume of postural re-edu drills. Pt's seizure disorder is under control at this time; she is continuing f/u  with specialist for tachycardia. Patient has remaining deficits in  occipital/suboccipital HA with referral to peri-orbital region, intermittent R-sided neck pain and R upper limb paresthesias, postural changes, and positional intolerance for lying supine. Patient will benefit from continued skilled therapeutic intervention to address the above deficits as needed for improved function and QoL.    OBJECTIVE IMPAIRMENTS: decreased ROM, decreased strength, hypomobility, impaired UE functional use, postural dysfunction, and pain.   ACTIVITY LIMITATIONS: carrying, lifting, and sleeping  PARTICIPATION LIMITATIONS: occupation  and crocheting  PERSONAL FACTORS: Past/current experiences, Time since onset of injury/illness/exacerbation, and 3+ comorbidities: (seizure disorder, anxiety, depression)  are also affecting patient's functional outcome.   REHAB POTENTIAL: Good  CLINICAL DECISION MAKING: Evolving/moderate complexity  EVALUATION COMPLEXITY: Moderate   GOALS: Goals reviewed with patient? Yes  SHORT TERM GOALS: Target date: 04/05/2023  Pt will be independent with HEP to improve strength and decrease neck pain to improve pain-free function at home and work. Baseline: 03/15/23:  Goal status: INITIAL   LONG TERM GOALS: Target date: 04/27/2023  Pt will increase FOTO to at least 62 to demonstrate significant improvement in function at home and work related to neck pain  Baseline: 03/15/23: 48 Goal status: INITIAL  2.  Pt will decrease worst neck pain by at least 2 points on the NPRS in order to demonstrate clinically significant reduction in neck pain. Baseline: 03/15/23:  Goal status: INITIAL  3.  Pt will have full cervical spine AROM without reproduction of symptoms as needed for scanning environment and performing reaching and overhead activity Baseline: 03/15/23: Motion loss with flexion, extension, and R rotation; pain with flexion, bilat lateral flexion, R rotation.  Goal status:  INITIAL  4.  Patient will sleep through the night and report no disturbance with position changes as needed for improved sleep quality and long-term health/wellness Baseline: 03/15/23: Difficulty with lying to R side and with lying on back, interference with sleep Goal status: INITIAL   PLAN: PT FREQUENCY: 2x/week  PT DURATION: 6 weeks  PLANNED INTERVENTIONS: Therapeutic exercises, Patient/Family education, Self Care, Joint mobilization, Joint manipulation, Dry Needling, Electrical stimulation, Spinal manipulation, Spinal mobilization, Cryotherapy, Moist heat, Taping, Traction, Manual therapy, and Re-evaluation.  PLAN FOR NEXT SESSION: Re-assess repeated movement. Use cervical traction for symptom modulation. STM and C-spine mobilization.     Consuela Mimes, PT, DPT #[id number]  Gertie Exon, PT 04/19/2023, 10:32 AM

## 2023-04-19 NOTE — Assessment & Plan Note (Signed)
Doing well with PT.

## 2023-04-19 NOTE — Progress Notes (Signed)
Annual Physical Exam Visit  Patient Information:  Patient ID: Jill Woodard, female DOB: 2001/02/05 Age: 22 y.o. MRN: [id number]   Subjective:   CC: Annual Physical Exam  HPI:  Jill Woodard is here for their annual physical.  I reviewed the past medical history, family history, social history, surgical history, and allergies today and changes were made as necessary.  Please see the problem list section below for additional details.  Past Medical History: Past Medical History:  Diagnosis Date   Allergy    Anxiety    Asthma    Depression with anxiety 01/03/2023   Epilepsy (HCC)    Healthcare maintenance 04/19/2023   Herpes    Seizures (HCC)    Thyromegaly 04/19/2023   Past Surgical History: Past Surgical History:  Procedure Laterality Date   ADENOIDECTOMY     BRAIN SURGERY     FINGER FRACTURE SURGERY     TYMPANOSTOMY TUBE PLACEMENT     Family History: Family History  Problem Relation Age of Onset   Anxiety disorder Mother    Arthritis Mother    Cancer Mother    Obesity Mother    Varicose Veins Mother    Ovarian cysts Mother    Alcohol abuse Father    Drug abuse Father    Depression Sister    ADD / ADHD Sister    Anxiety disorder Sister    Vision loss Sister    Supraventricular tachycardia Sister    Arthritis Maternal Grandmother    Asthma Maternal Grandmother    Diabetes Maternal Grandmother    Hearing loss Maternal Grandmother    Obesity Maternal Grandmother    Cancer Maternal Grandfather    Varicose Veins Maternal Grandfather    Cancer Paternal Grandfather    Allergies: Allergies  Allergen Reactions   Amoxicillin Rash and Hives   Dust Mite Extract Other (See Comments)   Other Hives    EEG Glue "Blisters", Red Rash   Grapefruit Extract Other (See Comments)    Lessens effect of medications   Voltaren [Diclofenac Sodium] Itching   Wound Dressings Hives, Itching and Rash   Health Maintenance: Health Maintenance  Topic Date Due    CHLAMYDIA SCREENING  Never done   COVID-19 Vaccine (3 - Pfizer risk series) 12/13/2019   Hepatitis C Screening  Never done   PAP-Cervical Cytology Screening  Never done   PAP SMEAR-Modifier  Never done   INFLUENZA VACCINE  03/16/2023   DTaP/Tdap/Td (7 - Td or Tdap) 04/16/2025   HPV VACCINES  Completed   HIV Screening  Completed    HM Colonoscopy     This patient has no relevant Health Maintenance data.      Medications: Current Outpatient Medications on File Prior to Visit  Medication Sig Dispense Refill   albuterol (PROVENTIL) (2.5 MG/3ML) 0.083% nebulizer solution Inhale into the lungs.     albuterol (VENTOLIN HFA) 108 (90 Base) MCG/ACT inhaler Inhale 1-2 puffs into the lungs every 6 (six) hours as needed for wheezing or shortness of breath. 18 g 2   chlorhexidine (PERIDEX) 0.12 % solution Use as directed 15 mLs in the mouth or throat daily. 120 mL 0   cloBAZam (ONFI PO) Take 15 mg by mouth in the morning.     cloBAZam (ONFI) 10 MG tablet Take 20 mg by mouth 2 (two) times daily.     clonazePAM (KLONOPIN) 0.5 MG tablet Take by mouth.     cyclobenzaprine (FLEXERIL) 5 MG tablet TAKE 1 TABLET  BY MOUTH 3 TIMES DAILY AS NEEDED FOR MUSCLE SPASMS 90 tablet 0   folic acid (FOLVITE) 1 MG tablet Take by mouth.     lamoTRIgine (LAMICTAL) 100 MG tablet Take by mouth 2 (two) times daily.     magic mouthwash (nystatin, lidocaine, diphenhydrAMINE, alum & mag hydroxide) suspension Swish and swallow 5 mLs 3 (three) times daily as needed for mouth pain. 180 mL 0   meloxicam (MOBIC) 15 MG tablet TAKE 1 TABLET BY MOUTH ONCE DAILY AS NEEDED FOR PAIN 30 tablet 0   NAYZILAM 5 MG/0.1ML SOLN      triamcinolone (KENALOG) 0.1 % paste Use as directed 1 Application in the mouth or throat daily. 5 g 0   Current Facility-Administered Medications on File Prior to Visit  Medication Dose Route Frequency Provider Last Rate Last Admin   medroxyPROGESTERone (DEPO-PROVERA) injection 150 mg  150 mg Intramuscular Q90  days Lenice Llamas, FNP   150 mg at 02/13/23 1125    Review of Systems: No headache, visual changes, nausea, vomiting, diarrhea, constipation, dizziness, abdominal pain, skin rash, fevers, chills, night sweats, swollen lymph nodes, weight loss, chest pain, body aches, joint swelling, muscle aches, shortness of breath, mood changes, visual or auditory hallucinations reported.  Objective:   Vitals:   04/19/23 0817  BP: 126/78  Pulse: 100  SpO2: 99%   Vitals:   04/19/23 0817  Weight: 174 lb (78.9 kg)  Height: 5\' 7"  (1.702 m)   Body mass index is 27.25 kg/m.  General: Well Developed, well nourished, and in no acute distress.  Neuro: Alert and oriented x3, extra-ocular muscles intact, sensation grossly intact. Cranial nerves II through XII are grossly intact, motor, sensory, and coordinative functions are intact. HEENT: Normocephalic, atraumatic, pupils equal round reactive to light, neck supple, no masses, no lymphadenopathy, thyroid symmetrically mildly enlarged. Oropharynx, nasopharynx, external ear canals are unremarkable. Skin: Warm and dry, no rashes noted.  Cardiac: Regular rate and rhythm, no murmurs rubs or gallops. No peripheral edema. Pulses symmetric. Respiratory: Clear to auscultation bilaterally. Not using accessory muscles, speaking in full sentences.  Abdominal: Soft, nontender, nondistended, positive bowel sounds, no masses, no organomegaly. Musculoskeletal: Shoulder, elbow, wrist, hip, knee, ankle stable, and with full range of motion.  Female chaperone initials: KG present throughout the physical examination.  Impression and Recommendations:   The patient was counselled, risk factors were discussed, and anticipatory guidance given.  Problem List Items Addressed This Visit       Endocrine   Thyromegaly    Incidentally noted on exam, patient subjectively reports some gradual enlargement over time when asked. This is in the setting of tachycardia evaluation.  See additional assessment(s) for plan details.        Nervous and Auditory   Seizure disorder [facility])    Being actively followed by neurology.      Relevant Medications   cloBAZam (ONFI) 10 MG tablet     Other   Tachycardia    Relatively asymptomatic with only intermittent palpitations. Was primarily noted during inpatient admission under neurology for EEG testing. Denies exogenous intakes that could elevate rate other than 24 oz sweet tea but has noted tachycardia on watch on days that she has abstained from caffeine. Sister with recent diagnosis of SVT. Was noted to have thyromegaly incidentally on exam today.  EKG with shortened PR otherwise non-specific. Possible pre-excitation / junctional conditions vs normal variant.   Plan: - Risk stratification labs - Referral for echocardiogram and cardiology - Avoidance of exacerbating factors (  caffeine, etc) - Follow-up in 3 months      Relevant Orders   EKG 12-Lead (Completed)   Ambulatory referral to Cardiology   ECHOCARDIOGRAM COMPLETE   TSH+T4F+T3Free+ThyAbs+TPO+VD25   Healthcare maintenance - Primary    Annual examination completed, risk stratification labs ordered, anticipatory guidance provided.  We will follow labs once resulted.      Relevant Orders   CBC   Comprehensive metabolic panel   Hepatitis C antibody   HIV Antibody (routine testing w rflx)   Lipid panel   TSH   VITAMIN D 25 Hydroxy (Vit-D Deficiency, Fractures)   Cervical radicular pain    Doing well with PT      Other Visit Diagnoses     Screening for HIV (human immunodeficiency virus)       Relevant Orders   HIV Antibody (routine testing w rflx)   Need for hepatitis C screening test       Relevant Orders   Hepatitis C antibody   Screening for lipoid disorders       Relevant Orders   Comprehensive metabolic panel   Lipid panel   Annual physical exam       Relevant Orders   CBC   Comprehensive metabolic panel   Hepatitis C antibody   HIV  Antibody (routine testing w rflx)   Lipid panel   TSH   VITAMIN D 25 Hydroxy (Vit-D Deficiency, Fractures)   Vitamin D deficiency       Relevant Orders   VITAMIN D 25 Hydroxy (Vit-D Deficiency, Fractures)   Routine screening for STI (sexually transmitted infection)       Relevant Orders   GC/Chlamydia Probe Amp([facility])   Gastroesophageal reflux disease with esophagitis without hemorrhage       Relevant Medications   pantoprazole (PROTONIX) 40 MG tablet        Orders & Medications Medications:  Meds ordered this encounter  Medications   pantoprazole (PROTONIX) 40 MG tablet    Sig: Take 1 tablet (40 mg total) by mouth daily as needed (reflux).    Dispense:  30 tablet    Refill:  3   Orders Placed This Encounter  Procedures   GC/Chlamydia Probe Amp([facility])   CBC   Comprehensive metabolic panel   Hepatitis C antibody   HIV Antibody (routine testing w rflx)   Lipid panel   TSH   VITAMIN D 25 Hydroxy (Vit-D Deficiency, Fractures)   TSH+T4F+T3Free+ThyAbs+TPO+VD25   Ambulatory referral to Cardiology   EKG 12-Lead   ECHOCARDIOGRAM COMPLETE     No follow-ups on file.    Jerrol Banana, MD, [facility]   Primary Care Sports Medicine Primary Care and Sports Medicine at [facility]

## 2023-04-19 NOTE — Assessment & Plan Note (Addendum)
Relatively asymptomatic with only intermittent palpitations. Was primarily noted during inpatient admission under neurology for EEG testing. Denies exogenous intakes that could elevate rate other than 24 oz sweet tea but has noted tachycardia on watch on days that she has abstained from caffeine. Sister with recent diagnosis of SVT. Was noted to have thyromegaly incidentally on exam today.  EKG with shortened PR otherwise non-specific. Possible pre-excitation / junctional conditions vs normal variant.   Plan: - Risk stratification labs - Referral for echocardiogram and cardiology - Avoidance of exacerbating factors (caffeine, etc) - Follow-up in 3 months

## 2023-04-19 NOTE — Assessment & Plan Note (Signed)
Incidentally noted on exam, patient subjectively reports some gradual enlargement over time when asked. This is in the setting of tachycardia evaluation. See additional assessment(s) for plan details.

## 2023-04-19 NOTE — Assessment & Plan Note (Signed)
Being actively followed by neurology.

## 2023-04-19 NOTE — Patient Instructions (Addendum)
-   Obtain fasting labs with orders provided (can have water or black coffee but otherwise no food or drink x 8 hours before labs) - Review information provided - Attend eye doctor annually, dentist every 6 months, work towards or maintain 30 minutes of moderate intensity physical activity at least 5 days per week, and consume a balanced diet - Return in 3 months - Contact us for any questions between now and then  Additionally: - Referral coordinator will contact you to schedule echocardiogram and visit with cardiology - Use reflux medication (pantoprazole) daily as-needed for reflux symptoms - Make lifestyle changes where appropriate to limit reflux symptoms (see information attached)

## 2023-04-19 NOTE — Assessment & Plan Note (Signed)
Annual examination completed, risk stratification labs ordered, anticipatory guidance provided.  We will follow labs once resulted. 

## 2023-04-20 LAB — LIPID PANEL
Chol/HDL Ratio: 3.5 ratio (ref 0.0–4.4)
Cholesterol, Total: 168 mg/dL (ref 100–199)
HDL: 48 mg/dL (ref >39)
LDL Chol Calc (NIH): 104 mg/dL — ABNORMAL HIGH (ref 0–99)
Triglycerides: 83 mg/dL (ref 0–149)
VLDL Cholesterol Cal: 16 mg/dL (ref 5–40)

## 2023-04-20 LAB — COMPREHENSIVE METABOLIC PANEL
ALT: 20 IU/L (ref 0–32)
AST: 19 IU/L (ref 0–40)
Albumin: 4.6 g/dL (ref 4.0–5.0)
Alkaline Phosphatase: 79 IU/L (ref 44–121)
BUN/Creatinine Ratio: 13 (ref 9–23)
BUN: 8 mg/dL (ref 6–20)
Bilirubin Total: 0.3 mg/dL (ref 0.0–1.2)
CO2: 22 mmol/L (ref 20–29)
Calcium: 10.2 mg/dL (ref 8.7–10.2)
Chloride: 103 mmol/L (ref 96–106)
Creatinine, Ser: 0.64 mg/dL (ref 0.57–1.00)
Globulin, Total: 3 g/dL (ref 1.5–4.5)
Glucose: 83 mg/dL (ref 70–99)
Potassium: 4.1 mmol/L (ref 3.5–5.2)
Sodium: 140 mmol/L (ref 134–144)
Total Protein: 7.6 g/dL (ref 6.0–8.5)
eGFR: 129 mL/min/{1.73_m2} (ref >59)

## 2023-04-20 LAB — VITAMIN D 25 HYDROXY (VIT D DEFICIENCY, FRACTURES): Vit D, 25-Hydroxy: 48.8 ng/mL (ref 30.0–100.0)

## 2023-04-20 LAB — CBC
Hematocrit: 37.2 % (ref 34.0–46.6)
Hemoglobin: 12.4 g/dL (ref 11.1–15.9)
MCH: 27.2 pg (ref 26.6–33.0)
MCHC: 33.3 g/dL (ref 31.5–35.7)
MCV: 82 fL (ref 79–97)
Platelets: 418 10*3/uL (ref 150–450)
RBC: 4.56 x10E6/uL (ref 3.77–5.28)
RDW: 13.7 % (ref 11.7–15.4)
WBC: 6.4 10*3/uL (ref 3.4–10.8)

## 2023-04-20 LAB — TSH: TSH: 1.11 u[IU]/mL (ref 0.450–4.500)

## 2023-04-20 LAB — SPECIMEN STATUS REPORT

## 2023-04-20 LAB — HIV ANTIBODY (ROUTINE TESTING W REFLEX): HIV Screen 4th Generation wRfx: NONREACTIVE

## 2023-04-20 LAB — HEPATITIS C ANTIBODY: Hep C Virus Ab: NONREACTIVE

## 2023-04-21 ENCOUNTER — Encounter: Payer: Self-pay | Admitting: Cardiology

## 2023-04-21 LAB — GC/CHLAMYDIA PROBE AMP
Chlamydia trachomatis, NAA: NEGATIVE
Neisseria Gonorrhoeae by PCR: NEGATIVE

## 2023-04-21 LAB — TSH+T4F+T3FREE+THYABS+TPO+VD25
Free T4: 1.28 ng/dL (ref 0.82–1.77)
T3, Free: 3.4 pg/mL (ref 2.0–4.4)
TSH: 1.04 u[IU]/mL (ref 0.450–4.500)
Thyroglobulin Antibody: <1 [IU]/mL (ref 0.0–0.9)
Thyroperoxidase Ab SerPl-aCnc: 30 [IU]/mL (ref 0–34)
Vit D, 25-Hydroxy: 52.7 ng/mL (ref 30.0–100.0)

## 2023-04-21 LAB — SPECIMEN STATUS REPORT

## 2023-04-24 ENCOUNTER — Ambulatory Visit: Payer: Medicaid Other | Admitting: Physical Therapy

## 2023-04-25 ENCOUNTER — Emergency Department
Admission: EM | Admit: 2023-04-25 | Discharge: 2023-04-26 | Disposition: A | Discharge status: Home / Self Care | Payer: Medicaid Other | Attending: Emergency Medicine | Admitting: Emergency Medicine

## 2023-04-25 ENCOUNTER — Other Ambulatory Visit: Payer: Self-pay

## 2023-04-25 ENCOUNTER — Encounter: Payer: Self-pay | Admitting: Emergency Medicine

## 2023-04-25 ENCOUNTER — Emergency Department: Payer: Medicaid Other

## 2023-04-25 ENCOUNTER — Encounter: Payer: Self-pay | Admitting: Family Medicine

## 2023-04-25 DIAGNOSIS — R7989 Other specified abnormal findings of blood chemistry: Secondary | ICD-10-CM | POA: Insufficient documentation

## 2023-04-25 DIAGNOSIS — E049 Nontoxic goiter, unspecified: Secondary | ICD-10-CM | POA: Diagnosis not present

## 2023-04-25 DIAGNOSIS — E041 Nontoxic single thyroid nodule: Secondary | ICD-10-CM | POA: Diagnosis not present

## 2023-04-25 DIAGNOSIS — R0789 Other chest pain: Secondary | ICD-10-CM | POA: Diagnosis not present

## 2023-04-25 DIAGNOSIS — M542 Cervicalgia: Secondary | ICD-10-CM | POA: Diagnosis present

## 2023-04-25 DIAGNOSIS — Z0389 Encounter for observation for other suspected diseases and conditions ruled out: Secondary | ICD-10-CM | POA: Diagnosis not present

## 2023-04-25 LAB — CBC
HCT: 37.8 % (ref 36.0–46.0)
Hemoglobin: 12.2 g/dL (ref 12.0–15.0)
MCH: 27.4 pg (ref 26.0–34.0)
MCHC: 32.3 g/dL (ref 30.0–36.0)
MCV: 84.8 fL (ref 80.0–100.0)
Platelets: 439 10*3/uL — ABNORMAL HIGH (ref 150–400)
RBC: 4.46 MIL/uL (ref 3.87–5.11)
RDW: 13.2 % (ref 11.5–15.5)
WBC: 5.9 10*3/uL (ref 4.0–10.5)
nRBC: 0 % (ref 0.0–0.2)

## 2023-04-25 LAB — BASIC METABOLIC PANEL
Anion gap: 9 (ref 5–15)
BUN: 9 mg/dL (ref 6–20)
CO2: 22 mmol/L (ref 22–32)
Calcium: 9.3 mg/dL (ref 8.9–10.3)
Chloride: 107 mmol/L (ref 98–111)
Creatinine, Ser: 0.67 mg/dL (ref 0.44–1.00)
GFR, Estimated: >60 mL/min (ref >60)
Glucose, Bld: 101 mg/dL — ABNORMAL HIGH (ref 70–99)
Potassium: 3.3 mmol/L — ABNORMAL LOW (ref 3.5–5.1)
Sodium: 138 mmol/L (ref 135–145)

## 2023-04-25 LAB — GROUP A STREP BY PCR: Group A Strep by PCR: NOT DETECTED

## 2023-04-25 LAB — TROPONIN I (HIGH SENSITIVITY)
Troponin I (High Sensitivity): <2 ng/L (ref <18)
Troponin I (High Sensitivity): 3 ng/L (ref <18)

## 2023-04-25 LAB — POC URINE PREG, ED: Preg Test, Ur: NEGATIVE

## 2023-04-25 LAB — D-DIMER, QUANTITATIVE: D-Dimer, Quant: 0.85 ug{FEU}/mL — ABNORMAL HIGH (ref 0.00–0.50)

## 2023-04-25 MED ORDER — IOHEXOL 350 MG/ML SOLN
75.0000 mL | Freq: Once | INTRAVENOUS | Status: AC | PRN
  Administered 2023-04-25: 75 mL via INTRAVENOUS

## 2023-04-25 MED ORDER — IBUPROFEN 600 MG PO TABS
600.0000 mg | ORAL_TABLET | Freq: Once | ORAL | Status: AC
  Administered 2023-04-25: 600 mg via ORAL
  Filled 2023-04-25: qty 1

## 2023-04-25 NOTE — Telephone Encounter (Signed)
Spoke to patient on phone now, 9:50 AM - having left sharp neck pain, chest pain that is not reproducible. Advised going to ER.

## 2023-04-25 NOTE — ED Notes (Signed)
RN unable to get PIV. Awaiting [place] RN at this time.

## 2023-04-25 NOTE — ED Provider Notes (Signed)
Shared visit   22 year old female presents to the emergency department with heart palpitations and sore throat and feeling like she is having swelling in her throat.  Recently tested for thyroid studies given concern for possible thyroid goiter.  Thyroid studies were normal.  Feels like since that time she has had worsening swelling to her throat.  Also endorses ongoing feeling like her heart is racing.  Mild intermittent chest tightness.  No significant shortness of breath.  History of epilepsy.  No estrogen and only takes Depo shot.  Low risk Wells criteria, D-dimer was elevated so added on a CTA to evaluate for pulmonary embolism.  Given sore throat and feeling like her throat is swelling we will do a CT scan soft tissue neck to evaluate for abscess.  Strep test negative.  No signs of pulmonary embolism.  CT scan with concerns for thyroid goiter.  Discussed outpatient follow-up and workup.  Given return precautions for any worsening symptoms.   Corena Herter, MD 04/25/23 2358

## 2023-04-25 NOTE — ED Triage Notes (Signed)
Pt sts that she has been having a high rate and pain on the left side of her neck. Pt has a VNS device on the same side of her neck. Pt sts that she is suppose to have a Echo done in the middle of this month to rule out SVT.

## 2023-04-25 NOTE — Discharge Instructions (Signed)
You were seen in the emergency department for pain in your throat and a racing heart rate.  You had a CT scan of your chest that did not show any blood clots or signs of pneumonia.  You had a CT scan of your neck that showed thyroid nodules.  Your strep test was negative.  Your lab work was reassuring.  On reviewing your chart your thyroid studies were normal.  Your sore throat and pain is most likely from your thyroid nodules.  You can take Motrin and Tylenol for pain control.  Return to the emergency department for any worsening symptoms.  Pain control:  Ibuprofen (motrin/aleve/advil) - You can take 3 tablets (600 mg) every 6 hours as needed for pain/fever.  Acetaminophen (tylenol) - You can take 2 extra strength tablets (1000 mg) every 6 hours as needed for pain/fever.  You can alternate these medications or take them together.  Make sure you eat food/drink water when taking these medications.

## 2023-04-25 NOTE — ED Provider Notes (Signed)
[facility] Emergency Department Provider Note     Event Date/Time   First MD Initiated Contact with Patient 04/25/23 1727     (approximate)   History   Chest Injury   HPI  Jill Woodard is a 22 y.o. female with a history of seizures, epilepsy, thyromegaly and anxiety who presents to the ED with complaint of left-sided neck pain and increased heart rate x 2 days.  Quality of neck pain is described as constant "squeezing" with intermittent sharp pain with ascending radiation.  Pain score 8/10. Patient has an implantable vagal nerve stimulator placed in 2016 on the left upper chest area which is the same side of her neck pain.  She has concerns this is causing her pain.  Patient reports first scheduled appointment for battery replacement on 09/27.  Patient also reports echo scheduled on 09/26 for increased pulse rate.  Patient endorses chest tightness.  Denies shortness of breath.     Physical Exam   Triage Vital Signs: ED Triage Vitals  Encounter Vitals Group     BP 04/25/23 1246 (!) 164/106     Systolic BP Percentile --      Diastolic BP Percentile --      Pulse Rate 04/25/23 1246 (!) 120     Resp 04/25/23 1246 18     Temp 04/25/23 1246 98.9 F (37.2 C)     Temp Source 04/25/23 1246 Oral     SpO2 04/25/23 1246 98 %     Weight 04/25/23 1247 173 lb 15.1 oz (78.9 kg)     Height 04/25/23 1247 5\' 7"  (1.702 m)     Head Circumference --      Peak Flow --      Pain Score 04/25/23 1246 9     Pain Loc --      Pain Education --      Exclude from Growth Chart --     Most recent vital signs: Vitals:   04/25/23 1246 04/25/23 1844  BP: (!) 164/106 (!) 143/99  Pulse: (!) 120 98  Resp: 18 17  Temp: 98.9 F (37.2 C) 98.7 F (37.1 C)  SpO2: 98% 97%   General: Nontoxic alert and oriented. INAD.     Head:  NCAT.  Eyes:  PERRLA. EOMI.  Neck:   Enlarged left thyroid gland.  Tenderness to palpation.   CV:  Good peripheral perfusion.  Tachy. RESP:  Normal effort. LCTAB. No retractions.  NEURO: Cranial nerves intact. No focal deficits. Sensation and motor function intact. 5/5 muscle strength of UE & LE.   ED Results / Procedures / Treatments   Labs (all labs ordered are listed, but only abnormal results are displayed) Labs Reviewed  BASIC METABOLIC PANEL - Abnormal; Notable for the following components:      Result Value   Potassium 3.3 (*)    Glucose, Bld 101 (*)    All other components within normal limits  CBC - Abnormal; Notable for the following components:   Platelets 439 (*)    All other components within normal limits  D-DIMER, QUANTITATIVE (NOT AT [facility]) - Abnormal; Notable for the following components:   D-Dimer, Quant 0.85 (*)    All other components within normal limits  GROUP A STREP BY PCR  POC URINE PREG, ED  TROPONIN I (HIGH SENSITIVITY)  TROPONIN I (HIGH SENSITIVITY)   EKG  Sinus tachycardia  RADIOLOGY  I personally viewed and evaluated these images as part of my medical decision  making, as well as reviewing the written report by the radiologist.  ED Provider Interpretation: Checks x-ray is unremarkable.  No heart enlargement and lungs are unremarkable.  DG Chest 2 View  Result Date: 04/25/2023 CLINICAL DATA:  CP EXAM: CHEST - 2 VIEW COMPARISON:  None Available. FINDINGS: Cardiac silhouette is unremarkable. No pneumothorax or pleural effusion. The lungs are clear. The visualized skeletal structures are unremarkable. Electronic stimulation device overlies the left upper chest. IMPRESSION: No acute cardiopulmonary process. Electronically Signed   By: Layla Maw M.D.   On: 04/25/2023 13:23    PROCEDURES:  Critical Care performed: No  Procedures  MEDICATIONS ORDERED IN ED: Medications  ibuprofen (ADVIL) tablet 600 mg (600 mg Oral Given 04/25/23 1845)    IMPRESSION / MDM / ASSESSMENT AND PLAN / ED COURSE  I reviewed the triage vital signs and the nursing notes.                                22 y.o. female presents to the emergency department for evaluation and treatment of neck pain x 3 days. See HPI for further details. Vital signs and physical exam are pertinent for pulse of 120 bpm and an enlarged left thyroid gland that is tender to palpation.   Differential diagnosis includes, but is not limited to PE, strep pharyngitis, stimulator dysfunction, enlarged thyroid, abscess.   Patient's presentation is most consistent with acute complicated illness / injury requiring diagnostic workup.  Patient is alert and oriented.  She is nontoxic and in no acute distress.  Her physical exam findings are stated above.  She does not complain of shortness of breath.  EKG reveals sinus tachycardia. chest x-ray is reassuring. Her CBC and BMP are reassuring.  Troponin is normal.  Given her complaint of chest tightness we will order a D-dimer to rule out PE.  Strep test is negative.  Vital signs are improving. 164/106 --> 143/99 ; Pulse rate 120--> 98  ----------------------------------------- 8:23 PM on 04/25/2023 -----------------------------------------  Shared visit with attending provider Dr. Arnoldo Morale who will assume care of patient with plan to order a CT angio chest to rule out PE given that her D-dimer is elevated at 0.85 and CT soft tissue neck with contrast  FINAL CLINICAL IMPRESSION(S) / ED DIAGNOSES   Final diagnoses:  None   Rx / DC Orders   ED Discharge Orders     None      Note:  This document was prepared using Dragon voice recognition software and may include unintentional dictation errors.    Romeo Apple, Airis Barbee A, PA-C 04/25/23 2024    Corena Herter, MD 04/26/23 [phone]

## 2023-04-25 NOTE — Telephone Encounter (Signed)
Please advise 

## 2023-04-26 ENCOUNTER — Telehealth: Payer: Self-pay

## 2023-04-26 ENCOUNTER — Ambulatory Visit: Payer: Medicaid Other | Admitting: Physical Therapy

## 2023-04-26 NOTE — Transitions of Care (Post Inpatient/ED Visit) (Signed)
04/26/2023  Name: Jill Woodard MRN: [id number] DOB: 02/09/2001  Today's TOC FU Call Status: Today's TOC FU Call Status:: Successful TOC FU Call Completed TOC FU Call Complete Date: 04/26/23 Patient's Name and Date of Birth confirmed.  Transition Care Management Follow-up Telephone Call Date of Discharge: 04/25/23 Discharge Facility: [facility]) Type of Discharge: Emergency Department Reason for ED Visit: Other: How have you been since you were released from the hospital?: Same Any questions or concerns?: No  Items Reviewed: Did you receive and understand the discharge instructions provided?: No Medications obtained,verified, and reconciled?: Yes (Medications Reviewed) Any new allergies since your discharge?: No Dietary orders reviewed?: No Do you have support at home?: No People in Home: parent(s)  Medications Reviewed Today: Medications Reviewed Today     Reviewed by Jerl Santos, CMA (Certified Medical Assistant) on 04/26/23 at 0913  Med List Status: <None>   Medication Order Taking? Sig Documenting Provider Last Dose Status Informant  albuterol (PROVENTIL) (2.5 MG/3ML) 0.083% nebulizer solution [id number] No Inhale into the lungs. [provider] Taking Active   albuterol (VENTOLIN HFA) 108 (90 Base) MCG/ACT inhaler [id number] No Inhale 1-2 puffs into the lungs every 6 (six) hours as needed for wheezing or shortness of breath. Jerrol Banana, MD Taking Active   chlorhexidine (PERIDEX) 0.12 % solution [id number] No Use as directed 15 mLs in the mouth or throat daily. Margaretann Loveless, PA-C Taking Active   cloBAZam (ONFI PO) [id number] No Take 15 mg by mouth in the morning. [provider] Taking Active   cloBAZam (ONFI) 10 MG tablet [id number] No Take 20 mg by mouth 2 (two) times daily. [provider] Taking Active   clonazePAM (KLONOPIN) 0.5 MG tablet [id number] No Take by mouth. [provider]  Taking Active   cyclobenzaprine (FLEXERIL) 5 MG tablet [id number] No TAKE 1 TABLET BY MOUTH 3 TIMES DAILY AS NEEDED FOR MUSCLE SPASMS Jerrol Banana, MD Taking Active   folic acid (FOLVITE) 1 MG tablet [id number] No Take by mouth. [provider] Taking Active   lamoTRIgine (LAMICTAL) 100 MG tablet [id number] No Take by mouth 2 (two) times daily. [provider] Taking Active   magic mouthwash (nystatin, lidocaine, diphenhydrAMINE, alum & mag hydroxide) suspension [id number] No Swish and swallow 5 mLs 3 (three) times daily as needed for mouth pain. Jerrol Banana, MD Taking Active   medroxyPROGESTERone (DEPO-PROVERA) injection 150 mg [id number]   Lenice Llamas, FNP  Active   meloxicam [facility]) 15 MG tablet [id number] No TAKE 1 TABLET BY MOUTH ONCE DAILY AS NEEDED FOR PAIN Jerrol Banana, MD Taking Active   NAYZILAM 5 MG/0.1ML SOLN [id number] No  [provider] Taking Active   pantoprazole (PROTONIX) 40 MG tablet [id number]  Take 1 tablet (40 mg total) by mouth daily as needed (reflux). Jerrol Banana, MD  Active   triamcinolone (KENALOG) 0.1 % paste [id number] No Use as directed 1 Application in the mouth or throat daily. Margaretann Loveless, PA-C Taking Active             Home Care and Equipment/Supplies: Were Home Health Services Ordered?: No Any new equipment or medical supplies ordered?: No  Functional Questionnaire: Do you need assistance with bathing/showering or dressing?: No Do you need assistance with meal preparation?: No Do you need assistance with eating?: No Do you have difficulty maintaining continence: No Do you need assistance with getting out of bed/getting out of a chair/moving?: No Do you have  difficulty managing or taking your medications?: No  Follow up appointments reviewed: PCP Follow-up appointment confirmed?: Yes MD Provider Line [phone] Given: Yes Date of PCP follow-up appointment?: 04/27/23 Follow-up Provider:  [facility] Follow-up appointment confirmed?: No Reason Specialist Follow-Up Not Confirmed: Appointment Sceduled by [facility] Calling Clinician Do you need transportation to your follow-up appointment?: No Do you understand care options if your condition(s) worsen?: Yes-patient verbalized understanding    SIGNATURE

## 2023-04-26 NOTE — Therapy (Deleted)
OUTPATIENT PHYSICAL THERAPY TREATMENT   Patient Name: Jill Woodard MRN: [id number] DOB:2001-08-09, 22 y.o., female Today's Date: 04/26/2023  END OF SESSION:     Past Medical History:  Diagnosis Date   Allergy    Anxiety    Asthma    Depression with anxiety 01/03/2023   Epilepsy (HCC)    Healthcare maintenance 04/19/2023   Herpes    Seizures (HCC)    Thyromegaly 04/19/2023   Past Surgical History:  Procedure Laterality Date   ADENOIDECTOMY     BRAIN SURGERY     FINGER FRACTURE SURGERY     TYMPANOSTOMY TUBE PLACEMENT     Patient Active Problem List   Diagnosis Date Noted   Asthma 04/19/2023   Tachycardia 04/19/2023   Healthcare maintenance 04/19/2023   Thyromegaly 04/19/2023   Cervical radicular pain 02/14/2023   Depression with anxiety 01/03/2023   Right wrist tendinitis 01/03/2023   Focal seizures (HCC) 10/19/2022   Hx of migraines 12/07/2021   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  ONSET DATE: 03/14/2022 pain in R wrist, more pain in neck 3-4 months ago  FOLLOW-UP APPT SCHEDULED WITH REFERRING PROVIDER: Yes , next f/u with Dr. Ashley Royalty 04/19/23  Pertinent History 22 year old female with primary complaint of R-sided neck pain and R upper limb referred pain; she also has pain along radial aspect of R wrist. Per referring physician, pt reports chronic neck tightness and stiffness, right upper extremity shooting pain, intermittent paresthesias. Past med Hx including seizure disorder, anxiety, depression.   Patient reports getting out of hospital due to seizures last week. Patient reports having seizures  some over last 2 days; today is better. Pt currently reports difficulty with turning her head to the R - more pain with forcing to R. She reports notable stiffness and feeling of knots along sides of neck and back of neck. She reports pain in shoulders. She reports pain with sitting up straight. She reports Hx of severe scoliosis. Pt reports no change with use of topical Biofreeze or use of wrist brace. Pt has vagus nerve stimulator on her L side. Pt reports difficulty with lying on back and having to keep neck slightly to L.   Pain:  Pain Intensity: Described as stiffness presently versus pain  Best: 3/10 at best, Worst: 7/10 Pain location: Pain along R lower paracervical region and UT. Intermittent pain into ulnar aspect of wrist and toward 5th digit Pain Quality:  ]shooting into R wrist/ulnar side, throbbing/jolting and tightness close to neck/UT   Radiating: Yes , to RUE down to R wrist  Numbness/Tingling: Yes, down to R wrist  Focal Weakness: Yes; pt reports some difficulty with gripping and moving items, dropped bowl by accident one time. Sometimes pinky into 4th-5th digits, sometimes 1st-2nd digit. Seldom on L side. NT is usually on R side.  Aggravating factors: turning head to R, lying flat on back Relieving factors: lying to L side, Acetaminophen, Meloxicam 24-hour pain behavior: worse in AM upon waking, worse again at mid-afternoon  History of prior neck injury, pain, surgery, or therapy: No Dominant hand: right Imaging: Yes ;  02/14/23 Negative cervical spine radiographs.    Red flags (personal history  of cancer, h/o spinal tumors, history of compression fracture, chills/fever, night sweats, nausea, vomiting, unrelenting pain): Chills/fever, nausea/vomiting and chills with seizure activity    PRECAUTIONS: Other: Seizure history   WEIGHT BEARING RESTRICTIONS: No  FALLS: Has patient fallen in last 6 months? 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   Hobbies: crotcheting, crafts  Patient Goals: Able to turn head better, improved stiffness, improved strength and ability to use R hand     OBJECTIVE:   Posture Forward head, rounded shoulder posture; inc thoracic kyphosis, mild L rib hump (Hx of scoliosis)   AROM AROM (Normal range in degrees) AROM 03/15/23  Cervical  Flexion (50) 46* (pull on R)  Extension (80) 46  Right lateral flexion (45) WNL*  Left lateral flexion (45) WNL*  Right rotation (85) 62*  Left rotation (85) 87 (mild pull on R)  (* = pain; Blank rows = not tested)  (03/22/23) Thoracic AROM: WNL with exception of moderate motion loss and low back pain with thoracic extension   Shoulder AROM: Flexion WNL bilat, abduction 130 deg R WFL L Wrist AROM: WNL, pain with flexion and ulnar deviation on R    MMT MMT (out of 5) Right 03/15/23 Left 03/15/23      Shoulder   Flexion 4+ 4+  Extension    Abduction 4+ 4+  Internal rotation 5 5  Horizontal abduction    Horizontal adduction    Lower Trapezius    Rhomboids        Elbow  Flexion 5 5  Extension 5 5  Pronation    Supination        Wrist  Flexion 5   Extension 5   Radial deviation 5   Ulnar deviation 5       (* = pain; Blank rows = not tested)  Sensation Grossly intact to light touch bilateral UE as determined by testing dermatomes C2-T2. Proprioception and hot/cold testing deferred on this date.  Reflexes R/L Elbow: 2+/2+  Brachioradialis: 2+/2+  Tricep: 2+/2+ UMN signs: Hoffman's negative and Clonus negative (only 1-2 beats per foot)    Palpation Location LEFT  RIGHT           Suboccipitals 0 1 (mild)  Cervical paraspinals 0 1  Upper Trapezius  0  Levator Scapulae  1  Rhomboid Major/Minor  1  (Blank rows = not tested) Graded on 0-4 scale (0 = no pain,  1 = pain, 2 = pain with wincing/grimacing/flinching, 3 = pain with withdrawal, 4 = unwilling to allow palpation), (Blank rows = not tested)  Repeated Movements Repeated cervical retraction: R UT tightness during, no worse after  Passive Accessory Intervertebral Motion Deferred   SPECIAL TESTS Spurlings A (ipsilateral lateral flexion/axial compression): R: Positive L: Negative Distraction Test: Positive  Hoffman Sign (cervical cord compression): R: Negative L: Negative  ULTT (03/22/23): Median nerve (+), Ulnar and radial nerve (-)      TODAY'S TREATMENT   SUBJECTIVE STATEMENT: Patent reports having significant pain along L upper cervical/subcoccipital region on Sunday that led to headache around her L eye. She states that sharp pain is still there, though HA is gone. Patient reports refraining from sidebend stretching to R side due to this symptom. Pt is following up with cardiology regarding arrhythmia and intermittent tachycardia sometimes with and  without palpitations.   HR: 100 bpm at rest    Manual Therapy - for symptom modulation, soft tissue sensitivity and mobility, joint mobility, C-spine ROM    STM/DTM R splenius cervicis/capitis/ R upper trapezius; x 12 minutes  SOR and upper cervical distraction; x 8 minutes  Cervical spine sideglides, R to L and L to R at C4-6 levels; 2 x 30 sec ea dir   *not today* Manual cervical traction in supine; 10 sec on, 10 sec off; x 5 minutes for nerve root decompression and symptom modulation   Therapeutic Exercise - for improved soft tissue flexibility and extensibility as needed for ROM, repeated movement for symptom modulation and to improve overall ROM   AROM Cervical flexion:  WNL Cervical extension: WNL Lateral flexion: Right WNL , Left WNL Cervical rotation: Right WNL, Left WNL -No pain with AROM today   Bruegger's posture endurance; x10, 10 sec hold, with Green Tband   Standing postural row; 2x10, 5 sec hold; with  Black Tband   PATIENT EDUCATION: We reviewed HEP and encouraged continued home program; pt instructed to f/u with PT if still experiencing notable HA symptoms.    *not today* Median nerve flossing; 1x10  -for HEP review; pt demonstrates sound technique with minimal verbal cueing -R sidebend sustained: pt felt numbness/pain to R thumb, stopped R sidebend stretch  -only completed L sidebend with 2nd set Repeated cervical retraction, supine; 1x10, 1 sec hold  Cervical sidebend stretch; 2 x 30 sec     PATIENT EDUCATION:  Education details: see above for patient education details Person educated: Patient Education method: Explanation, Demonstration, and Handouts Education comprehension: verbalized understanding and returned demonstration   HOME EXERCISE PROGRAM:  Access Code: [email or web address] URL: [email or web address] Date: 04/12/2023 Prepared by: Consuela Mimes  Exercises - Seated Cervical Retraction and Extension  - 5-6 x daily - 7 x weekly - 1 sets - 10 reps - 1sec hold - Seated Self Cervical Traction  - 7 x weekly - 1 sets - 10 reps - 5-10sec hold - Median Nerve Flossing - Tray  - 2 x daily - 7 x weekly - 2 sets - 10 reps - 1 sec hold   ASSESSMENT:  CLINICAL IMPRESSION: Patient fortunately has normal cervical spine AROM without reproduction of symptoms. She has experienced recent occipital headache with referral to frontal/peri-orbital region. Pt responds well with manual therapy today and is able to continue with low volume of postural re-edu drills. Pt's seizure disorder is under control at this time; she is continuing f/u with specialist for tachycardia. Patient has remaining deficits in  occipital/suboccipital HA with referral to peri-orbital region, intermittent R-sided neck pain and R upper limb paresthesias, postural changes, and positional intolerance for lying supine. Patient will benefit from continued skilled therapeutic intervention to address the above  deficits as needed for improved function and QoL.    OBJECTIVE IMPAIRMENTS: decreased ROM, decreased strength, hypomobility, impaired UE functional use, postural dysfunction, and pain.   ACTIVITY LIMITATIONS: carrying, lifting, and sleeping  PARTICIPATION LIMITATIONS: occupation and crocheting  PERSONAL FACTORS: Past/current experiences, Time since onset of injury/illness/exacerbation, and 3+ comorbidities: (seizure disorder, anxiety, depression)  are also affecting patient's functional outcome.   REHAB POTENTIAL: Good  CLINICAL DECISION MAKING: Evolving/moderate complexity  EVALUATION COMPLEXITY: Moderate   GOALS: Goals reviewed with patient? Yes  SHORT TERM GOALS: Target date: 04/05/2023  Pt will be independent with HEP to improve strength and decrease neck pain to improve pain-free function at home and work. Baseline: 03/15/23:  Goal  status: INITIAL   LONG TERM GOALS: Target date: 04/27/2023  Pt will increase FOTO to at least 62 to demonstrate significant improvement in function at home and work related to neck pain  Baseline: 03/15/23: 48 Goal status: INITIAL  2.  Pt will decrease worst neck pain by at least 2 points on the NPRS in order to demonstrate clinically significant reduction in neck pain. Baseline: 03/15/23:  Goal status: INITIAL  3.  Pt will have full cervical spine AROM without reproduction of symptoms as needed for scanning environment and performing reaching and overhead activity Baseline: 03/15/23: Motion loss with flexion, extension, and R rotation; pain with flexion, bilat lateral flexion, R rotation.  Goal status: INITIAL  4.  Patient will sleep through the night and report no disturbance with position changes as needed for improved sleep quality and long-term health/wellness Baseline: 03/15/23: Difficulty with lying to R side and with lying on back, interference with sleep Goal status: INITIAL   PLAN: PT FREQUENCY: 2x/week  PT DURATION: 6  weeks  PLANNED INTERVENTIONS: Therapeutic exercises, Patient/Family education, Self Care, Joint mobilization, Joint manipulation, Dry Needling, Electrical stimulation, Spinal manipulation, Spinal mobilization, Cryotherapy, Moist heat, Taping, Traction, Manual therapy, and Re-evaluation.  PLAN FOR NEXT SESSION: Re-assess repeated movement. Use cervical traction for symptom modulation. STM and C-spine mobilization.     Consuela Mimes, PT, DPT #[id number]  Gertie Exon, PT 04/26/2023, 7:51 AM

## 2023-04-27 ENCOUNTER — Telehealth: Payer: Medicaid Other | Admitting: Family Medicine

## 2023-04-27 DIAGNOSIS — F418 Other specified anxiety disorders: Secondary | ICD-10-CM

## 2023-04-27 DIAGNOSIS — E041 Nontoxic single thyroid nodule: Secondary | ICD-10-CM | POA: Diagnosis not present

## 2023-04-27 NOTE — Progress Notes (Signed)
Primary Care / Sports Medicine Virtual Visit  Patient Information:  Patient ID: Jill Woodard, female DOB: 2001-07-08 Age: 22 y.o. MRN: [id number]   Jill Woodard is a pleasant 22 y.o. female presenting with the following:  No chief complaint on file.   Review of Systems: No fevers, chills, night sweats, weight loss, chest pain, or shortness of breath.   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   albuterol (VENTOLIN HFA) 108 (90 Base) MCG/ACT inhaler Inhale 1-2 puffs into the lungs every 6 (six) hours as needed for wheezing or shortness of breath.   chlorhexidine (PERIDEX) 0.12 % solution Use as directed 15 mLs in the mouth or throat daily.   cloBAZam (ONFI PO) Take 15 mg by mouth in the morning.   cloBAZam (ONFI) 10 MG tablet Take 20 mg by mouth 2 (two) times daily.   clonazePAM (KLONOPIN) 0.5 MG tablet Take by mouth.   cyclobenzaprine (FLEXERIL) 5 MG tablet TAKE 1 TABLET BY MOUTH 3 TIMES DAILY AS NEEDED FOR MUSCLE SPASMS   folic acid (FOLVITE) 1 MG tablet Take by mouth.   lamoTRIgine (LAMICTAL) 100 MG tablet Take by mouth 2 (two) times daily.   magic mouthwash (nystatin, lidocaine,  diphenhydrAMINE, alum & mag hydroxide) suspension Swish and swallow 5 mLs 3 (three) times daily as needed for mouth pain.   meloxicam (MOBIC) 15 MG tablet TAKE 1 TABLET BY MOUTH ONCE DAILY AS NEEDED FOR PAIN   NAYZILAM 5 MG/0.1ML SOLN    pantoprazole (PROTONIX) 40 MG tablet Take 1 tablet (40 mg total) by mouth daily as needed (reflux).   triamcinolone (KENALOG) 0.1 % paste Use as directed 1 Application in the mouth or throat daily.   Facility-Administered Encounter Medications as of 04/27/2023  Medication   medroxyPROGESTERone (DEPO-PROVERA) injection 150 mg   Past Surgical History:  Procedure Laterality Date   ADENOIDECTOMY     BRAIN SURGERY     FINGER FRACTURE SURGERY     TYMPANOSTOMY TUBE PLACEMENT      Virtual Visit via MyChart Video:   I connected with Jill Woodard on 05/09/23 via MyChart Video and verified that I am speaking with the correct person using appropriate identifiers.   The limitations, risks, security and privacy concerns of performing an evaluation and management service by MyChart Video, including the higher likelihood of inaccurate diagnoses and treatments, and the availability of in person appointments were reviewed. The possible need of an additional face-to-face encounter for complete and high quality delivery of care was discussed. The patient was also made aware that there may be a patient responsible charge related to this service. The patient expressed understanding and wishes to proceed.  Provider location is in medical facility. Patient location is at their home, different from provider location. People involved in care of the patient during this telehealth encounter were myself, my nurse/medical assistant, and my front office/scheduling team member.  Objective findings:   General: Speaking full sentences, no audible heavy breathing. Sounds alert and appropriately interactive. Well-appearing. Face symmetric. Extraocular movements intact. Pupils  equal and round. No nasal flaring or accessory muscle use visualized.  Independent interpretation of notes and tests performed by another provider:   None  Pertinent History, Exam, Impression, and Recommendations:   Problem List Items Addressed This Visit       Endocrine   Thyroid nodule    Incidentally noted thyromegaly at last CPE, symptomatic neck pain, ultimately went to ER based on symptoms and recommendation. There was noted to have thyroid nodule. Has appropriate follow-up scheduled, we will follow peripherally and coordinate additional specialists as needed.        Other   Depression with anxiety - Primary    Chronic, ongoing symptomatology in the setting of multiple medical comorbidities. We reviewed management strategies, both pharmacologic and nonpharmacologic.  Plan: - Initiate low-dose sertraline 25 mg and reassess in 4-6 weeks - If suboptimal control can consider titration vs alternate regimen if adverse effects noted        Orders & Medications Medications: No orders of the defined types were placed in this encounter.  No orders of the defined types were placed in this encounter.    I discussed the above assessment and treatment plan with the patient. The patient was provided an opportunity to ask questions and all were answered. The patient agreed with the plan and demonstrated an understanding of the instructions.   The patient was advised to call back or seek an in-person evaluation if the symptoms worsen or if the condition fails to improve as anticipated.   I provided a total time of 30 minutes including both face-to-face and non-face-to-face time on 05/09/2023 inclusive of time utilized for medical chart review, information gathering, care coordination with staff, and documentation completion.    Jerrol Banana, MD, [facility]   Primary Care Sports Medicine Primary Care and Sports Medicine at [facility]

## 2023-04-29 ENCOUNTER — Encounter: Payer: Self-pay | Admitting: Family Medicine

## 2023-05-01 ENCOUNTER — Other Ambulatory Visit: Payer: Self-pay

## 2023-05-01 DIAGNOSIS — K219 Gastro-esophageal reflux disease without esophagitis: Secondary | ICD-10-CM | POA: Diagnosis not present

## 2023-05-01 DIAGNOSIS — G40209 Localization-related (focal) (partial) symptomatic epilepsy and epileptic syndromes with complex partial seizures, not intractable, without status epilepticus: Secondary | ICD-10-CM | POA: Diagnosis not present

## 2023-05-01 DIAGNOSIS — E041 Nontoxic single thyroid nodule: Secondary | ICD-10-CM | POA: Diagnosis not present

## 2023-05-01 DIAGNOSIS — M542 Cervicalgia: Secondary | ICD-10-CM | POA: Diagnosis not present

## 2023-05-01 DIAGNOSIS — D689 Coagulation defect, unspecified: Secondary | ICD-10-CM | POA: Diagnosis not present

## 2023-05-01 DIAGNOSIS — Z01818 Encounter for other preprocedural examination: Secondary | ICD-10-CM | POA: Diagnosis not present

## 2023-05-01 DIAGNOSIS — R569 Unspecified convulsions: Secondary | ICD-10-CM | POA: Diagnosis not present

## 2023-05-01 DIAGNOSIS — R000 Tachycardia, unspecified: Secondary | ICD-10-CM | POA: Diagnosis not present

## 2023-05-01 DIAGNOSIS — R002 Palpitations: Secondary | ICD-10-CM | POA: Diagnosis not present

## 2023-05-01 NOTE — Telephone Encounter (Signed)
Please advise on medication

## 2023-05-02 ENCOUNTER — Other Ambulatory Visit: Payer: Self-pay | Admitting: Family Medicine

## 2023-05-02 DIAGNOSIS — R002 Palpitations: Secondary | ICD-10-CM | POA: Insufficient documentation

## 2023-05-02 MED ORDER — SERTRALINE HCL 25 MG PO TABS: 25.0000 mg | ORAL_TABLET | Freq: Every day | ORAL | 3 refills | Status: DC

## 2023-05-02 NOTE — Telephone Encounter (Signed)
Please advise 

## 2023-05-02 NOTE — Telephone Encounter (Signed)
done

## 2023-05-04 DIAGNOSIS — G40209 Localization-related (focal) (partial) symptomatic epilepsy and epileptic syndromes with complex partial seizures, not intractable, without status epilepticus: Secondary | ICD-10-CM | POA: Diagnosis not present

## 2023-05-04 DIAGNOSIS — D689 Coagulation defect, unspecified: Secondary | ICD-10-CM | POA: Diagnosis not present

## 2023-05-09 ENCOUNTER — Encounter: Payer: Self-pay | Admitting: Family Medicine

## 2023-05-09 ENCOUNTER — Ambulatory Visit: Payer: Medicaid Other

## 2023-05-09 DIAGNOSIS — G40219 Localization-related (focal) (partial) symptomatic epilepsy and epileptic syndromes with complex partial seizures, intractable, without status epilepticus: Secondary | ICD-10-CM | POA: Diagnosis not present

## 2023-05-09 DIAGNOSIS — K219 Gastro-esophageal reflux disease without esophagitis: Secondary | ICD-10-CM | POA: Diagnosis not present

## 2023-05-09 DIAGNOSIS — Z4542 Encounter for adjustment and management of neuropacemaker (brain) (peripheral nerve) (spinal cord): Secondary | ICD-10-CM | POA: Diagnosis not present

## 2023-05-09 NOTE — Assessment & Plan Note (Signed)
Incidentally noted thyromegaly at last CPE, symptomatic neck pain, ultimately went to ER based on symptoms and recommendation. There was noted to have thyroid nodule. Has appropriate follow-up scheduled, we will follow peripherally and coordinate additional specialists as needed.

## 2023-05-09 NOTE — Assessment & Plan Note (Signed)
Chronic, ongoing symptomatology in the setting of multiple medical comorbidities. We reviewed management strategies, both pharmacologic and nonpharmacologic.  Plan: - Initiate low-dose sertraline 25 mg and reassess in 4-6 weeks - If suboptimal control can consider titration vs alternate regimen if adverse effects noted

## 2023-05-11 ENCOUNTER — Ambulatory Visit
Admission: RE | Admit: 2023-05-11 | Discharge: 2023-05-11 | Disposition: A | Discharge status: Home / Self Care | Payer: Medicaid Other | Source: Ambulatory Visit | Attending: Family Medicine | Admitting: Family Medicine

## 2023-05-11 DIAGNOSIS — R008 Other abnormalities of heart beat: Secondary | ICD-10-CM | POA: Diagnosis not present

## 2023-05-11 DIAGNOSIS — R569 Unspecified convulsions: Secondary | ICD-10-CM | POA: Insufficient documentation

## 2023-05-11 DIAGNOSIS — F32A Depression, unspecified: Secondary | ICD-10-CM | POA: Insufficient documentation

## 2023-05-11 DIAGNOSIS — R000 Tachycardia, unspecified: Secondary | ICD-10-CM | POA: Insufficient documentation

## 2023-05-11 DIAGNOSIS — F419 Anxiety disorder, unspecified: Secondary | ICD-10-CM | POA: Insufficient documentation

## 2023-05-11 DIAGNOSIS — I340 Nonrheumatic mitral (valve) insufficiency: Secondary | ICD-10-CM | POA: Diagnosis not present

## 2023-05-11 LAB — ECHOCARDIOGRAM COMPLETE
AR max vel: 2.64 cm2
AV Area VTI: 2.5 cm2
AV Area mean vel: 2.57 cm2
AV Mean grad: 2 mmHg
AV Peak grad: 3.8 mmHg
Ao pk vel: 0.98 m/s
Area-P 1/2: 5.42 cm2
Calc EF: 46.1 %
S' Lateral: 3.2 cm
Single Plane A2C EF: 43.4 %
Single Plane A4C EF: 48.1 %

## 2023-05-11 NOTE — Progress Notes (Signed)
*  PRELIMINARY RESULTS* Echocardiogram 2D Echocardiogram has been performed.  Cristela Blue 05/11/2023, 9:50 AM

## 2023-05-15 ENCOUNTER — Ambulatory Visit: Payer: Self-pay | Admitting: *Deleted

## 2023-05-15 ENCOUNTER — Other Ambulatory Visit: Payer: Self-pay

## 2023-05-15 DIAGNOSIS — B379 Candidiasis, unspecified: Secondary | ICD-10-CM

## 2023-05-15 MED ORDER — FLUCONAZOLE 150 MG PO TABS
150.0000 mg | ORAL_TABLET | Freq: Once | ORAL | 0 refills | Status: AC
Start: 2023-05-15 — End: 2023-05-15

## 2023-05-15 NOTE — Telephone Encounter (Signed)
Per agent: "Vaginal yeast and itching   Pt is calling to report vaginal yeast with itching. Pt reports that she has used OTC creme's that have not worked. Pt reports that she is not able to come in for an appt. Please advise."     Chief Complaint: Vaginal Itching Symptoms: Vaginal itching, slight milky discharge Frequency: 2-3 days ago Pertinent Negatives: Patient denies rash, pain, odor Disposition: [] ED /[] Urgent Care (no appt availability in office) / [x] Appointment(In office/virtual)/ []  [facility]/ [] Home Care/ [] Refused Recommended Disposition /[] [facility]/ []  Follow-up with PCP Additional Notes:  Pt states has tried OTC meds, ineffective. HAs had yeast infections in past. States can not come in for appt due to work schedule. Virtual appt secured for tomorrow with Dr. Judithann Graves. Pt requested early appt "Before I work at 12 noon."  Care advise provided, pt verbalizes understanding.  Reason for Disposition  [1] Symptoms of a "yeast infection" (i.e., itchy, white discharge, not bad smelling) AND [2] not improved > 3 days following Care Advice    2-3 days. HAs tried OTC meds  Answer Assessment - Initial Assessment Questions 1. DISCHARGE: "Describe the discharge." (e.g., white, yellow, green, gray, foamy, cottage cheese-like)     Milky like 2. ODOR: "Is there a bad odor?"     No 3. ONSET: "When did the discharge begin?"     2-3 days ago 4. RASH: "Is there a rash in the genital area?" If Yes, ask: "Describe it." (e.g., redness, blisters, sores, bumps)     No 5. ABDOMEN PAIN: "Are you having any abdomen pain?" If Yes, ask: "What does it feel like? " (e.g., crampy, dull, intermittent, constant)      NA 6. ABDOMEN PAIN SEVERITY: If present, ask: "How bad is it?" (e.g., Scale 1-10; mild, moderate, or severe)   - MILD (1-3): Doesn't interfere with normal activities, abdomen soft and not tender to touch.    - MODERATE (4-7): Interferes with normal activities or  awakens from sleep, abdomen tender to touch.    - SEVERE (8-10): Excruciating pain, doubled over, unable to do any normal activities. (R/O peritonitis)      No 7. CAUSE: "What do you think is causing the discharge?" "Have you had the same problem before? What happened then?"     Yeast 8. OTHER SYMPTOMS: "Do you have any other symptoms?" (e.g., fever, itching, vaginal bleeding, pain with urination, injury to genital area, vaginal foreign body)     Itching  Protocols used: Vaginal Discharge-A-AH

## 2023-05-16 ENCOUNTER — Telehealth: Payer: Medicaid Other | Admitting: Family Medicine

## 2023-05-16 ENCOUNTER — Telehealth: Payer: Medicaid Other | Admitting: Internal Medicine

## 2023-05-17 ENCOUNTER — Ambulatory Visit: Payer: Medicaid Other | Admitting: Family Medicine

## 2023-05-23 ENCOUNTER — Encounter: Payer: Self-pay | Admitting: Family Medicine

## 2023-05-23 ENCOUNTER — Ambulatory Visit: Payer: Medicaid Other | Admitting: Family Medicine

## 2023-05-23 VITALS — BP 110/74 | HR 92 | Wt 173.2 lb

## 2023-05-23 DIAGNOSIS — Z3042 Encounter for surveillance of injectable contraceptive: Secondary | ICD-10-CM

## 2023-05-23 DIAGNOSIS — Z30013 Encounter for initial prescription of injectable contraceptive: Secondary | ICD-10-CM | POA: Diagnosis not present

## 2023-05-23 DIAGNOSIS — Z309 Encounter for contraceptive management, unspecified: Secondary | ICD-10-CM | POA: Diagnosis not present

## 2023-05-23 DIAGNOSIS — Z01419 Encounter for gynecological examination (general) (routine) without abnormal findings: Secondary | ICD-10-CM | POA: Diagnosis not present

## 2023-05-23 DIAGNOSIS — Z124 Encounter for screening for malignant neoplasm of cervix: Secondary | ICD-10-CM

## 2023-05-23 DIAGNOSIS — R87612 Low grade squamous intraepithelial lesion on cytologic smear of cervix (LGSIL): Secondary | ICD-10-CM | POA: Diagnosis not present

## 2023-05-23 NOTE — Progress Notes (Signed)
[facility] [address] Main Number: [phone]  Family Planning Visit- Repeat Yearly Visit  Subjective:  Jill Woodard is a 22 y.o. G0P0000  being seen today for cervical cytology with Reflex HPV in indicated and to discuss contraception options.   The patient is currently using Hormonal Injection for pregnancy prevention. Patient does not want a pregnancy in the next year.   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  Chief Complaint  Patient presents with   Contraception    PAP/Depo    Patient reports no concerns today and only requests scheduled Depo and PAP now that she is 22 years old.   See flowsheet for other program required questions.   Body mass index is 27.13 kg/m. - Patient is eligible for diabetes screening based on BMI> 25 and age >35?  no HA1C ordered? not applicable  Desires STI screening?  No - testing performed in July, 2024,(3 months prior).   Has patient been screened once for HCV in the past?  No  No results found for: "HCVAB"  Does the patient have current of drug use, have a partner with drug use, and/or has been incarcerated since last result? Not assessed.   If yes-- Screen for HCV through [facility]   Does the patient meet criteria for HBV testing? Not assessed.   Criteria:  -Household, sexual or needle sharing contact with HBV -History of drug use -HIV positive -Those with known Hep C   Health Maintenance Due  Topic Date Due   COVID-19 Vaccine (3 - Pfizer risk series) 12/13/2019   Cervical Cancer  Screening (Pap smear)  Never done   INFLUENZA VACCINE  03/16/2023    The following portions of the patient's history were reviewed and updated as appropriate: allergies, current medications, past family history, past medical history, past social history, past surgical history and problem list. Problem list updated.  Objective:   Vitals:   05/23/23 0938  BP: 110/74  Pulse: 92  Weight: 173 lb 3.2 oz (78.6 kg)    Physical Exam Vitals and nursing note reviewed. Exam conducted with a chaperone present Cameron Proud, RN present as chaperon for PE.).  Constitutional:      Appearance: Normal appearance.  HENT:     Head: Normocephalic.     Mouth/Throat:     Lips: Pink.  Eyes:     General:        Right eye: No discharge.        Left eye: No discharge.  Pulmonary:     Effort: Pulmonary effort is normal.  Genitourinary:    General: Normal vulva.     Exam position: Lithotomy position.     Pubic Area: No rash or pubic lice.      Tanner stage (genital): 5.     Labia:        Right: No rash, tenderness, lesion or injury.        Left: No rash, tenderness, lesion or injury.      Vagina: Normal. No vaginal discharge.     Cervix: Discharge present.     Uterus: Normal.      Comments: pH not tested as  patient declined STI testing today.  Scant amount of normal appearing vaginal discharge present internally around the cervical os and within vaginal vault.  Musculoskeletal:        General: Normal range of motion.  Lymphadenopathy:     Lower Body: No right inguinal adenopathy. No left inguinal adenopathy.  Skin:    General: Skin is warm and dry.     Comments: Exposed areas only. Skin tone appropriate for ethnicity.   Neurological:     Mental Status: She is alert and oriented to person, place, and time.  Psychiatric:        Attention and Perception: Attention normal.        Mood and Affect: Mood normal.        Speech: Speech normal.        Behavior: Behavior normal. Behavior is  cooperative.    Assessment and Plan:  Jill Woodard is a 22 y.o. female G0P0000 presenting to the [facility] Department for an yearly wellness and contraception visit  Contraception counseling: Reviewed options based on patient desire and reproductive life plan. Patient is interested in Hormonal Injection. This was provided to the patient today.   Risks, benefits, and typical effectiveness rates were reviewed.  Questions were answered.  Written information was also given to the patient to review.    The patient will follow up in  3 months for surveillance.  The patient was told to call with any further questions, or with any concerns about this method of contraception.  Emphasized use of condoms 100% of the time for STI prevention.  Educated on ECP and assessed need for ECP. Patient was NOT offered ECP based on no unprotected sex within the last 5 days.    Encounter for Depo-Provera contraception Discussed beginning a calcium and vitamin D supplement due to risk of bone thinning with Depo. Patient advised to continue taking a multi-vitamin with folic acid since she is of child bearing age.  Depo injection given by RN per SO.   2. Encounter for Papanicolaou smear for cervical cancer screening Complete physical not performed as patient was seen in July, 2 days prior to her 22st birthday, for annual physical exam. Due to her not being 22 at the time she did not have a PAP performed. Today only PAP with HPV reflex if indicated was performed.  Patient deferred STI testing today as it was performed at visit in July (approximately 3 months prior).  - IGP, rfx Aptima HPV ASCU   Return in about 3 months (around 08/23/2023) for Depo.  Future Appointments  Date Time Provider Department Center  06/13/2023  8:40 AM Jerrol Banana, MD [facility]  06/27/2023  8:20 AM Debbe Odea, MD [facility] None  07/20/2023  8:40 AM Jerrol Banana, MD [facility]   Total time with  patient 20 minutes.   Edmonia James, NP

## 2023-05-23 NOTE — Progress Notes (Signed)

## 2023-05-23 NOTE — Progress Notes (Signed)
Depo given in RUOQ, reminder card given.  Gaspar Garbe, RN

## 2023-05-25 DIAGNOSIS — Z09 Encounter for follow-up examination after completed treatment for conditions other than malignant neoplasm: Secondary | ICD-10-CM | POA: Diagnosis not present

## 2023-05-25 DIAGNOSIS — R569 Unspecified convulsions: Secondary | ICD-10-CM | POA: Diagnosis not present

## 2023-05-29 LAB — IGP, RFX APTIMA HPV ASCU: PAP Smear Comment: 0

## 2023-05-29 NOTE — Progress Notes (Signed)
LSIL, HPV testing not performed. Per ASCCP guidelines, next PAP due in 1 year. Please send letter to patient. Thank you, Marylu Lund L. Kissy Cielo, FNP-C

## 2023-06-13 ENCOUNTER — Telehealth (INDEPENDENT_AMBULATORY_CARE_PROVIDER_SITE_OTHER): Payer: Medicaid Other | Admitting: Family Medicine

## 2023-06-13 ENCOUNTER — Encounter: Payer: Self-pay | Admitting: Family Medicine

## 2023-06-13 VITALS — Ht 67.0 in

## 2023-06-13 DIAGNOSIS — F418 Other specified anxiety disorders: Secondary | ICD-10-CM | POA: Diagnosis not present

## 2023-06-13 MED ORDER — SERTRALINE HCL 50 MG PO TABS
50.0000 mg | ORAL_TABLET | Freq: Every day | ORAL | 0 refills | Status: DC
Start: 2023-06-13 — End: 2023-08-18

## 2023-06-13 NOTE — Patient Instructions (Signed)
-   Increase sertraline to 50 mg - Follow-up as scheduled for reevaluation

## 2023-06-13 NOTE — Addendum Note (Signed)
Addended by: Heywood Bene on: 06/13/2023 10:40 AM   Modules accepted: Orders

## 2023-06-13 NOTE — Assessment & Plan Note (Signed)
Has tolerated sertraline 25 mg well, though reporting no change, her PHQ and GAD have improved. Discusses ongoing stress, irritability, and sleep issues. We reviewed other medical comorbid conditions and need for follow-up with endocrinology and cardiology as she has scheduled.  - Will titrate sertraline to 50 mg - Follow-up as scheduled for reevaluation

## 2023-06-13 NOTE — Progress Notes (Signed)
Primary Care / Sports Medicine Virtual Visit  Patient Information:  Patient ID: Jill Woodard, female DOB: 02-10-2001 Age: 22 y.o. MRN: [id number]   Jill Woodard is a pleasant 22 y.o. female presenting with the following:  Chief Complaint  Patient presents with   Anxiety    Does not feels a difference, PHQ and GAD are better    Review of Systems: No fevers, chills, night sweats, weight loss, chest pain, or shortness of breath.   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   albuterol (VENTOLIN HFA) 108 (90 Base) MCG/ACT inhaler Inhale 1-2 puffs into the lungs every 6 (six) hours as needed for wheezing or shortness of breath.   chlorhexidine (PERIDEX) 0.12 % solution Use as directed 15 mLs in the mouth or throat daily.   cloBAZam (ONFI PO) Take 15 mg by mouth in the morning.   cloBAZam (ONFI) 10 MG tablet Take 20 mg by mouth 2 (two) times daily.   clonazePAM (KLONOPIN) 0.5 MG tablet Take by mouth.   cyclobenzaprine (FLEXERIL) 5 MG tablet TAKE 1 TABLET BY MOUTH 3 TIMES DAILY AS NEEDED FOR MUSCLE SPASMS   folic acid (FOLVITE) 1 MG tablet Take by mouth.   lamoTRIgine (LAMICTAL) 100 MG  tablet Take by mouth 2 (two) times daily.   magic mouthwash (nystatin, lidocaine, diphenhydrAMINE, alum & mag hydroxide) suspension Swish and swallow 5 mLs 3 (three) times daily as needed for mouth pain.   meloxicam (MOBIC) 15 MG tablet TAKE 1 TABLET BY MOUTH ONCE DAILY AS NEEDED FOR PAIN   NAYZILAM 5 MG/0.1ML SOLN    pantoprazole (PROTONIX) 40 MG tablet Take 1 tablet (40 mg total) by mouth daily as needed (reflux).   triamcinolone (KENALOG) 0.1 % paste Use as directed 1 Application in the mouth or throat daily.   [DISCONTINUED] sertraline (ZOLOFT) 25 MG tablet Take 1 tablet (25 mg total) by mouth daily.   sertraline (ZOLOFT) 50 MG tablet Take 1 tablet (50 mg total) by mouth daily.   Facility-Administered Encounter Medications as of 06/13/2023  Medication   medroxyPROGESTERone (DEPO-PROVERA) injection 150 mg   Past Surgical History:  Procedure Laterality Date   ADENOIDECTOMY     BRAIN SURGERY     FINGER FRACTURE SURGERY     TYMPANOSTOMY TUBE PLACEMENT      Virtual Visit via MyChart Video:   I connected with [name] on 06/13/23 via MyChart Video and verified that I am speaking with the correct person using appropriate identifiers.   The limitations, risks, security and privacy concerns of performing an evaluation and management service by MyChart Video, including the higher likelihood of inaccurate diagnoses and treatments, and the  availability of in person appointments were reviewed. The possible need of an additional face-to-face encounter for complete and high quality delivery of care was discussed. The patient was also made aware that there may be a patient responsible charge related to this service. The patient expressed understanding and wishes to proceed.  Provider location is in medical facility. Patient location is at their home, different from provider location. People involved in care of the patient during this telehealth encounter were myself, my nurse/medical  assistant, and my front office/scheduling team member.  Objective findings:   General: Speaking full sentences, no audible heavy breathing. Sounds alert and appropriately interactive. Well-appearing. Face symmetric. Extraocular movements intact. Pupils equal and round. No nasal flaring or accessory muscle use visualized.  Independent interpretation of notes and tests performed by another provider:   None  Pertinent History, Exam, Impression, and Recommendations:   Problem List Items Addressed This Visit       Other   Depression with anxiety - Primary    Has tolerated sertraline 25 mg well, though reporting no change, her PHQ and GAD have improved. Discusses ongoing stress, irritability, and sleep issues. We reviewed other medical comorbid conditions and need for follow-up with endocrinology and cardiology as she has scheduled.  - Will titrate sertraline to 50 mg - Follow-up as scheduled for reevaluation      Relevant Medications   sertraline (ZOLOFT) 50 MG tablet     Orders & Medications Medications:  Meds ordered this encounter  Medications   sertraline (ZOLOFT) 50 MG tablet    Sig: Take 1 tablet (50 mg total) by mouth daily.    Dispense:  90 tablet    Refill:  0   No orders of the defined types were placed in this encounter.    I discussed the above assessment and treatment plan with the patient. The patient was provided an opportunity to ask questions and all were answered. The patient agreed with the plan and demonstrated an understanding of the instructions.   The patient was advised to call back or seek an in-person evaluation if the symptoms worsen or if the condition fails to improve as anticipated.   I provided a total time of 30 minutes including both face-to-face and non-face-to-face time on 06/13/2023 inclusive of time utilized for medical chart review, information gathering, care coordination with staff, and documentation completion.    Jill Banana, MD,  [facility]   Primary Care Sports Medicine Primary Care and Sports Medicine at [facility]

## 2023-06-27 ENCOUNTER — Encounter: Payer: Self-pay | Admitting: Cardiology

## 2023-06-27 ENCOUNTER — Ambulatory Visit: Payer: Medicaid Other

## 2023-06-27 ENCOUNTER — Ambulatory Visit: Payer: Medicaid Other | Attending: Cardiology | Admitting: Cardiology

## 2023-06-27 VITALS — BP 104/66 | HR 97 | Ht 67.0 in | Wt 177.8 lb

## 2023-06-27 DIAGNOSIS — R000 Tachycardia, unspecified: Secondary | ICD-10-CM

## 2023-06-27 NOTE — Progress Notes (Signed)
Cardiology Office Note:    Date:  06/27/2023   ID:  Jill Woodard, DOB 12/17/2000, MRN [id number]  PCP:  Jerrol Banana, MD   [facility] Providers Cardiologist:  Debbe Odea, MD     Referring MD: Jerrol Banana, MD   Chief Complaint  Patient presents with   New Patient (Initial Visit)    Referred for cardiac evaluation of tachycardia with no known cardiac history.  Patient reports continued tachycardia episodes that is higher at rest.      History of Present Illness:    Jill Woodard is a 22 y.o. female with a hx of seizures, anxiety, depression, thyromegaly presenting with elevated heart rates.  Has noticed elevated heart rates for over 2 months.  Symptoms occur randomly, usually at rest.  Denies palpitations.  Has a smart watch/device which has noticed heart rates up to 130 bpm.  Elevated heart rates have also been noticed with seizure activity.  Denies smoking, smokes THC to help with stress.  Gets dizzy occasionally with standing up too quickly.  Denies syncope.  States sister has SVT.  Echocardiogram 04/2023 EF 50%  Past Medical History:  Diagnosis Date   Allergy    Anxiety    Asthma    Depression with anxiety 01/03/2023   Epilepsy (HCC)    Healthcare maintenance 04/19/2023   Herpes    Seizures (HCC)    Thyromegaly 04/19/2023    Past Surgical History:  Procedure Laterality Date   ADENOIDECTOMY     BRAIN SURGERY     FINGER FRACTURE SURGERY     TYMPANOSTOMY TUBE PLACEMENT      Current Medications: Current Meds  Medication Sig   albuterol (PROVENTIL) (2.5 MG/3ML) 0.083% nebulizer solution Inhale into the lungs.   albuterol (VENTOLIN HFA) 108 (90 Base) MCG/ACT inhaler Inhale 1-2 puffs into the lungs every 6 (six) hours as needed for wheezing or shortness of breath.   chlorhexidine (PERIDEX) 0.12 % solution Use as directed 15 mLs in the mouth or throat daily.   cloBAZam (ONFI PO) Take 15 mg by mouth in the morning.   cloBAZam  (ONFI) 10 MG tablet Take 20 mg by mouth 2 (two) times daily.   clonazePAM (KLONOPIN) 0.5 MG tablet Take 0.5 mg by mouth 2 (two) times daily.   cyclobenzaprine (FLEXERIL) 5 MG tablet TAKE 1 TABLET BY MOUTH 3 TIMES DAILY AS NEEDED FOR MUSCLE SPASMS   folic acid (FOLVITE) 1 MG tablet Take by mouth.   lamoTRIgine (LAMICTAL) 100 MG tablet Take by mouth 2 (two) times daily.   meloxicam (MOBIC) 15 MG tablet TAKE 1 TABLET BY MOUTH ONCE DAILY AS NEEDED FOR PAIN (Patient taking differently: Take 15 mg by mouth daily.)   Multiple Vitamins-Minerals (MULTIVITAL PO) Take by mouth daily.   NAYZILAM 5 MG/0.1ML SOLN    pantoprazole (PROTONIX) 40 MG tablet Take 1 tablet (40 mg total) by mouth daily as needed (reflux).   sertraline (ZOLOFT) 50 MG tablet Take 1 tablet (50 mg total) by mouth daily.   triamcinolone (KENALOG) 0.1 % paste Use as directed 1 Application in the mouth or throat daily.   Current Facility-Administered Medications for the 06/27/23 encounter (Office Visit) with Debbe Odea, MD  Medication   medroxyPROGESTERone (DEPO-PROVERA) injection 150 mg     Allergies:   Amoxicillin, Dust mite extract, Other, Grapefruit concentrate, Grapefruit extract, Vancomycin, Voltaren [diclofenac sodium], and Wound dressings   Social History   Socioeconomic History   Marital status: Single    Spouse  name: Not on file   Number of children: Not on file   Years of education: Not on file   Highest education level: 12th grade  Occupational History   Not on file  Tobacco Use   Smoking status: Never    Passive exposure: Never   Smokeless tobacco: Not on file  Vaping Use   Vaping status: Never Used  Substance and Sexual Activity   Alcohol use: No   Drug use: No   Sexual activity: Yes    Partners: Male    Birth control/protection: Injection, OCP  Other Topics Concern   Not on file  Social History Narrative   Not on file   Social Determinants of Health   Financial Resource Strain: Medium Risk  (06/23/2023)   Received from [facility]   Overall Financial Resource Strain (CARDIA)    Difficulty of Paying Living Expenses: Somewhat hard  Food Insecurity: No Food Insecurity (06/23/2023)   Received from [facility]   Hunger Vital Sign    Worried About Running Out of Food in the Last Year: Never true    Ran Out of Food in the Last Year: Never true  Transportation Needs: Unmet Transportation Needs (06/23/2023)   Received from [facility] - Transportation    In the past 12 months, has lack of transportation kept you from medical appointments or from getting medications?: Yes    Lack of Transportation (Non-Medical): No  Physical Activity: Insufficiently Active (04/18/2023)   Exercise Vital Sign    Days of Exercise per Week: 5 days    Minutes of Exercise per Session: 10 min  Stress: No Stress Concern Present (04/18/2023)   [employer] of Occupational Health - Occupational Stress Questionnaire    Feeling of Stress : Only a little  Social Connections: Socially Isolated (04/18/2023)   Social Connection and Isolation Panel [NHANES]    Frequency of Communication with Friends and Family: More than three times a week    Frequency of Social Gatherings with Friends and Family: Twice a week    Attends Religious Services: Never    Database administrator or Organizations: No    Attends Engineer, structural: Not on file    Marital Status: Never married     Family History: The patient's family history includes ADD / ADHD in her sister; Alcohol abuse in her father; Anxiety disorder in her mother and sister; Arthritis in her maternal grandmother and mother; Asthma in her maternal grandmother; Cancer in her maternal grandfather, mother, and paternal grandfather; Depression in her sister; Diabetes in her maternal grandmother; Drug abuse in her father; Hearing loss in her maternal grandmother; Obesity in her maternal grandmother and  mother; Ovarian cysts in her mother; Supraventricular tachycardia in her sister; Varicose Veins in her maternal grandfather and mother; Vision loss in her sister.  ROS:   Please see the history of present illness.     All other systems reviewed and are negative.  EKGs/Labs/Other Studies Reviewed:    The following studies were reviewed today:  EKG Interpretation Date/Time:  Tuesday June 27 2023 08:50:37 EST Ventricular Rate:  97 PR Interval:  114 QRS Duration:  82 QT Interval:  356 QTC Calculation: 452 R Axis:   81  Text Interpretation: Normal sinus rhythm Normal ECG Confirmed by Debbe Odea ([id number]) on 06/27/2023 9:05:35 AM    Recent Labs: 04/19/2023: ALT 20; TSH 1.110; TSH 1.040 04/25/2023: BUN 9; Creatinine, Ser 0.67; Hemoglobin 12.2; Platelets  439; Potassium 3.3; Sodium 138  Recent Lipid Panel    Component Value Date/Time   CHOL 168 04/19/2023 0000   TRIG 83 04/19/2023 0000   HDL 48 04/19/2023 0000   CHOLHDL 3.5 04/19/2023 0000   LDLCALC 104 (H) 04/19/2023 0000     Risk Assessment/Calculations:             Physical Exam:    VS:  BP 104/66 (BP Location: Left Arm, Patient Position: Sitting, Cuff Size: Normal)   Pulse 97   Ht 5\' 7"  (1.702 m)   Wt 177 lb 12.8 oz (80.6 kg)   SpO2 98%   BMI 27.85 kg/m     Wt Readings from Last 3 Encounters:  06/27/23 177 lb 12.8 oz (80.6 kg)  05/23/23 173 lb 3.2 oz (78.6 kg)  04/25/23 173 lb 15.1 oz (78.9 kg)     GEN:  Well nourished, well developed in no acute distress HEENT: Normal NECK: No JVD; No carotid bruits CARDIAC: RRR, no murmurs, rubs, gallops RESPIRATORY:  Clear to auscultation without rales, wheezing or rhonchi  ABDOMEN: Soft, non-tender, non-distended MUSCULOSKELETAL:  No edema; No deformity  SKIN: Warm and dry NEUROLOGIC:  Alert and oriented x 3 PSYCHIATRIC:  Normal affect   ASSESSMENT:    1. Tachycardia    PLAN:    In order of problems listed above:  Elevated heart rates, denies any  significant palpitations.  EKG today showing sinus rhythm heart rate 97.  Please cardiac monitor to evaluate any significant arrhythmias.  Reassure patient if no significant arrhythmias are noted.  Consider beta-blocker if average heart rate is elevated or significant arrhythmias noted.  Follow-up after cardiac monitor.       Medication Adjustments/Labs and Tests Ordered: Current medicines are reviewed at length with the patient today.  Concerns regarding medicines are outlined above.  Orders Placed This Encounter  Procedures   LONG TERM MONITOR (3-14 DAYS)   EKG 12-Lead   EKG 12-Lead   No orders of the defined types were placed in this encounter.   Patient Instructions  Medication Instructions:   Your physician recommends that you continue on your current medications as directed. Please refer to the Current Medication list given to you today.  *If you need a refill on your cardiac medications before your next appointment, please call your pharmacy*   Lab Work:  None Ordered  If you have labs (blood work) drawn today and your tests are completely normal, you will receive your results only by: MyChart Message (if you have MyChart) OR A paper copy in the mail If you have any lab test that is abnormal or we need to change your treatment, we will call you to review the results.   Testing/Procedures:  Your physician has recommended that you wear a Zio monitor.   This monitor is a medical device that records the heart's electrical activity. Doctors most often use these monitors to diagnose arrhythmias. Arrhythmias are problems with the speed or rhythm of the heartbeat. The monitor is a small device applied to your chest. You can wear one while you do your normal daily activities. While wearing this monitor if you have any symptoms to push the button and record what you felt. Once you have worn this monitor for the period of time provider prescribed (Usually 14 days), you will return  the monitor device in the postage paid box. Once it is returned they will download the data collected and provide [place] with a report which the provider will  then review and we will call you with those results. Important tips:  Avoid showering during the first 24 hours of wearing the monitor. Avoid excessive sweating to help maximize wear time. Do not submerge the device, no hot tubs, and no swimming pools. Keep any lotions or oils away from the patch. After 24 hours you may shower with the patch on. Take brief showers with your back facing the shower head.  Do not remove patch once it has been placed because that will interrupt data and decrease adhesive wear time. Push the button when you have any symptoms and write down what you were feeling. Once you have completed wearing your monitor, remove and place into box which has postage paid and place in your outgoing mailbox.  If for some reason you have misplaced your box then call our office and we can provide another box and/or mail it off for you.   Follow-Up: At [facility], you and your health needs are our priority.  As part of our continuing mission to provide you with exceptional heart care, we have created designated Provider Care Teams.  These Care Teams include your primary Cardiologist (physician) and Advanced Practice Providers (APPs -  Physician Assistants and Nurse Practitioners) who all work together to provide you with the care you need, when you need it.  We recommend signing up for the patient portal called "MyChart".  Sign up information is provided on this After Visit Summary.  MyChart is used to connect with patients for Virtual Visits (Telemedicine).  Patients are able to view lab/test results, encounter notes, upcoming appointments, etc.  Non-urgent messages can be sent to your provider as well.   To learn more about what you can do with MyChart, go to [email or web address].    Your next appointment:    6  weeks  Provider:   You will see one of the following Advanced Practice Providers on your designated Care Team:   Nicolasa Ducking, NP Eula Listen, PA-C Cadence Fransico Michael, PA-C Charlsie Quest, NP Carlos Levering, NP      Signed, Debbe Odea, MD  06/27/2023 10:16 AM    [facility]

## 2023-06-27 NOTE — Patient Instructions (Signed)
Medication Instructions:   Your physician recommends that you continue on your current medications as directed. Please refer to the Current Medication list given to you today.  *If you need a refill on your cardiac medications before your next appointment, please call your pharmacy*   Lab Work:  None Ordered  If you have labs (blood work) drawn today and your tests are completely normal, you will receive your results only by: MyChart Message (if you have MyChart) OR A paper copy in the mail If you have any lab test that is abnormal or we need to change your treatment, we will call you to review the results.   Testing/Procedures:  Your physician has recommended that you wear a Zio monitor.   This monitor is a medical device that records the heart's electrical activity. Doctors most often use these monitors to diagnose arrhythmias. Arrhythmias are problems with the speed or rhythm of the heartbeat. The monitor is a small device applied to your chest. You can wear one while you do your normal daily activities. While wearing this monitor if you have any symptoms to push the button and record what you felt. Once you have worn this monitor for the period of time provider prescribed (Usually 14 days), you will return the monitor device in the postage paid box. Once it is returned they will download the data collected and provide [place] with a report which the provider will then review and we will call you with those results. Important tips:  Avoid showering during the first 24 hours of wearing the monitor. Avoid excessive sweating to help maximize wear time. Do not submerge the device, no hot tubs, and no swimming pools. Keep any lotions or oils away from the patch. After 24 hours you may shower with the patch on. Take brief showers with your back facing the shower head.  Do not remove patch once it has been placed because that will interrupt data and decrease adhesive wear time. Push the button  when you have any symptoms and write down what you were feeling. Once you have completed wearing your monitor, remove and place into box which has postage paid and place in your outgoing mailbox.  If for some reason you have misplaced your box then call our office and we can provide another box and/or mail it off for you.   Follow-Up: At [facility], you and your health needs are our priority.  As part of our continuing mission to provide you with exceptional heart care, we have created designated Provider Care Teams.  These Care Teams include your primary Cardiologist (physician) and Advanced Practice Providers (APPs -  Physician Assistants and Nurse Practitioners) who all work together to provide you with the care you need, when you need it.  We recommend signing up for the patient portal called "MyChart".  Sign up information is provided on this After Visit Summary.  MyChart is used to connect with patients for Virtual Visits (Telemedicine).  Patients are able to view lab/test results, encounter notes, upcoming appointments, etc.  Non-urgent messages can be sent to your provider as well.   To learn more about what you can do with MyChart, go to [email or web address].    Your next appointment:    6 weeks  Provider:   You will see one of the following Advanced Practice Providers on your designated Care Team:   Nicolasa Ducking, NP Eula Listen, PA-C Cadence Fransico Michael, PA-C Charlsie Quest, NP Carlos Levering, NP

## 2023-06-28 DIAGNOSIS — E041 Nontoxic single thyroid nodule: Secondary | ICD-10-CM | POA: Diagnosis not present

## 2023-06-30 DIAGNOSIS — R000 Tachycardia, unspecified: Secondary | ICD-10-CM

## 2023-07-12 ENCOUNTER — Ambulatory Visit
Admission: EM | Admit: 2023-07-12 | Discharge: 2023-07-12 | Disposition: A | Discharge status: Home / Self Care | Payer: Medicaid Other | Attending: Emergency Medicine | Admitting: Emergency Medicine

## 2023-07-12 DIAGNOSIS — J0140 Acute pansinusitis, unspecified: Secondary | ICD-10-CM | POA: Diagnosis not present

## 2023-07-12 DIAGNOSIS — J4531 Mild persistent asthma with (acute) exacerbation: Secondary | ICD-10-CM

## 2023-07-12 MED ORDER — DOXYCYCLINE HYCLATE 100 MG PO CAPS: 100.0000 mg | ORAL_CAPSULE | Freq: Two times a day (BID) | ORAL | 0 refills | Status: DC

## 2023-07-12 MED ORDER — PROMETHAZINE-DM 6.25-15 MG/5ML PO SYRP: 5.0000 mL | ORAL_SOLUTION | Freq: Four times a day (QID) | ORAL | 0 refills | Status: DC | PRN

## 2023-07-12 MED ORDER — FLUTICASONE PROPIONATE 50 MCG/ACT NA SUSP: 2.0000 | Freq: Every day | NASAL | 0 refills | Status: DC

## 2023-07-12 MED ORDER — PREDNISONE 20 MG PO TABS: 40.0000 mg | ORAL_TABLET | Freq: Every day | ORAL | 0 refills | Status: AC

## 2023-07-12 NOTE — ED Triage Notes (Signed)
Sx x 1 week. Patient states that she is having sinus pain. She was on a zpack for dental problems. And didn't help the sinus pain.   Congestion-runny nose-stuffy nose-taste is off-smell is off. Patient did home covid test and was negative. Fever 99.4 last night. Patient has also been using OTC cold meds.

## 2023-07-12 NOTE — Discharge Instructions (Signed)
Finish the doxycycline prednisone, even if you feel better.  Saline nasal irrigation with a NeilMed sinus rinse and distilled water as often as you want, plain Mucinex and Flonase.  2 puffs from your albuterol inhaler using your spacer every 4 hours for 2 days, then every 6 hours for 2 days, then as needed.  You can back off on the albuterol if you start to improve sooner.  Promethazine DM as needed for cough.

## 2023-07-12 NOTE — ED Provider Notes (Signed)
HPI  SUBJECTIVE:  Jill Woodard is a 22 y.o. female who presents with 12 days of sinus pain and pressure, nasal congestion, green rhinorrhea, postnasal drip, cough productive of green sputum, shortness of breath, dyspnea on exertion, chest tightness.  She is waking up coughing at night.  She reports decreased appetite.  She has tried over-the-counter cold and flu medicines without improvement in her symptoms.  Symptoms worse with eating.  No fevers, facial swelling, upper dental pain, wheezing.  She was on azithromycin at the beginning of the illness for dental work.  She has a past medical history of asthma but has not been using her inhaler.  No admissions or recent steroid use.  She also has a history of seizures, allergy, status post brain surgery, tachycardia, thyroid nodule.  LMP: 2016.  Is on Depo.  Denies the possibility of being pregnant.  PCP: [facility].   Past Medical History:  Diagnosis Date   Allergy    Anxiety    Asthma    Depression with anxiety 01/03/2023   Epilepsy (HCC)    Healthcare maintenance 04/19/2023   Herpes    Seizures (HCC)    Thyromegaly 04/19/2023    Past Surgical History:  Procedure Laterality Date   ADENOIDECTOMY     BRAIN SURGERY     FINGER FRACTURE SURGERY     TYMPANOSTOMY TUBE PLACEMENT      Family History  Problem Relation Age of Onset   Anxiety disorder Mother    Arthritis Mother    Cancer Mother    Obesity Mother    Varicose Veins Mother    Ovarian cysts Mother    Alcohol abuse Father    Drug abuse Father    Depression Sister    ADD / ADHD Sister    Anxiety disorder Sister    Vision loss Sister    Supraventricular tachycardia Sister    Arthritis Maternal Grandmother    Asthma Maternal Grandmother    Diabetes Maternal Grandmother    Hearing loss Maternal Grandmother    Obesity Maternal Grandmother    Cancer Maternal Grandfather    Varicose Veins Maternal Grandfather    Cancer Paternal Grandfather     Social History    Tobacco Use   Smoking status: Never    Passive exposure: Never  Vaping Use   Vaping status: Never Used  Substance Use Topics   Alcohol use: No   Drug use: No     Current Facility-Administered Medications:    medroxyPROGESTERone (DEPO-PROVERA) injection 150 mg, 150 mg, Intramuscular, Q90 days, [place], [place], FNP, 150 mg at 05/23/23 6578  Current Outpatient Medications:    cloBAZam (ONFI PO), Take 15 mg by mouth in the morning., Disp: , Rfl:    clonazePAM (KLONOPIN) 0.5 MG tablet, Take 0.5 mg by mouth 2 (two) times daily., Disp: , Rfl:    doxycycline (VIBRAMYCIN) 100 MG capsule, Take 1 capsule (100 mg total) by mouth 2 (two) times daily for 10 days., Disp: 20 capsule, Rfl: 0   fluticasone (FLONASE) 50 MCG/ACT nasal spray, Place 2 sprays into both nostrils daily., Disp: 16 g, Rfl: 0   lamoTRIgine (LAMICTAL) 100 MG tablet, Take by mouth 2 (two) times daily., Disp: , Rfl:    NAYZILAM 5 MG/0.1ML SOLN, , Disp: , Rfl:    pantoprazole (PROTONIX) 40 MG tablet, Take 1 tablet (40 mg total) by mouth daily as needed (reflux)., Disp: 30 tablet, Rfl: 3   predniSONE (DELTASONE) 20 MG tablet, Take 2 tablets (40 mg  total) by mouth daily with breakfast for 5 days., Disp: 10 tablet, Rfl: 0   promethazine-dextromethorphan (PROMETHAZINE-DM) 6.25-15 MG/5ML syrup, Take 5 mLs by mouth 4 (four) times daily as needed for cough., Disp: 118 mL, Rfl: 0   sertraline (ZOLOFT) 50 MG tablet, Take 1 tablet (50 mg total) by mouth daily., Disp: 90 tablet, Rfl: 0   albuterol (PROVENTIL) (2.5 MG/3ML) 0.083% nebulizer solution, Inhale into the lungs., Disp: , Rfl:    albuterol (VENTOLIN HFA) 108 (90 Base) MCG/ACT inhaler, Inhale 1-2 puffs into the lungs every 6 (six) hours as needed for wheezing or shortness of breath., Disp: 18 g, Rfl: 2   chlorhexidine (PERIDEX) 0.12 % solution, Use as directed 15 mLs in the mouth or throat daily., Disp: 120 mL, Rfl: 0   cloBAZam (ONFI) 10 MG tablet, Take 20 mg by mouth 2 (two) times  daily., Disp: , Rfl:    cyclobenzaprine (FLEXERIL) 5 MG tablet, TAKE 1 TABLET BY MOUTH 3 TIMES DAILY AS NEEDED FOR MUSCLE SPASMS, Disp: 90 tablet, Rfl: 0   folic acid (FOLVITE) 1 MG tablet, Take by mouth., Disp: , Rfl:    Multiple Vitamins-Minerals (MULTIVITAL PO), Take by mouth daily., Disp: , Rfl:    triamcinolone (KENALOG) 0.1 % paste, Use as directed 1 Application in the mouth or throat daily., Disp: 5 g, Rfl: 0  Allergies  Allergen Reactions   Amoxicillin Rash and Hives   Dust Mite Extract Other (See Comments)   Other Hives    EEG Glue  "Blisters", Red Rash  EEG Glue "Blisters", Red Rash   Grapefruit Concentrate Other (See Comments)    Can not take with her medications lessens effect   Grapefruit Extract Other (See Comments)    Lessens effect of medications   Vancomycin Itching   Voltaren [Diclofenac Sodium] Itching   Wound Dressings Hives, Itching and Rash     ROS  As noted in HPI.   Physical Exam  BP 111/74 (BP Location: Left Arm)   Pulse 81   Temp 98.4 F (36.9 C) (Oral)   Resp 17   SpO2 100%   Constitutional: Well developed, well nourished, no acute distress Eyes:  EOMI, conjunctiva normal bilaterally HENT: Normocephalic, atraumatic,mucus membranes moist.  Mucoid nasal congestion.  Normal turbinates.  No maxillary, frontal sinus tenderness.  No obvious postnasal drip. Respiratory: Normal inspiratory effort, fair air movement.  Lungs clear bilaterally.  No anterior, lateral chest wall tenderness Cardiovascular: Normal rate, regular rhythm, no murmurs rubs or gallops GI: nondistended skin: No rash, skin intact Musculoskeletal: no deformities Neurologic: Alert & oriented x 3, no focal neuro deficits Psychiatric: Speech and behavior appropriate   ED Course   Medications - No data to display  No orders of the defined types were placed in this encounter.   No results found for this or any previous visit (from the past 24 hour(s)). No results found.  ED  Clinical Impression  1. Acute non-recurrent pansinusitis   2. Mild persistent asthma with acute exacerbation      ED Assessment/Plan     Patient has an acute exacerbation of a chronic problem.  Patient presents with sinusitis and I suspect mild asthma exacerbation.  She qualifies for antibiotics due to duration of symptoms.  Home with 10 days of doxycycline, Mucinex, Flonase, saline nasal irrigation.  Promethazine DM as needed for cough.  Regularly scheduled albuterol inhaler with a spacer for 4 days, then as needed thereafter.states she does not need a prescription for albuterol.  Prednisone 40  mg for 5 days for the asthma exacerbation.  Follow-up with PCP as needed.  Discussed MDM, treatment plan, and plan for follow-up with patient. patient agrees with plan.   Meds ordered this encounter  Medications   doxycycline (VIBRAMYCIN) 100 MG capsule    Sig: Take 1 capsule (100 mg total) by mouth 2 (two) times daily for 10 days.    Dispense:  20 capsule    Refill:  0   fluticasone (FLONASE) 50 MCG/ACT nasal spray    Sig: Place 2 sprays into both nostrils daily.    Dispense:  16 g    Refill:  0   promethazine-dextromethorphan (PROMETHAZINE-DM) 6.25-15 MG/5ML syrup    Sig: Take 5 mLs by mouth 4 (four) times daily as needed for cough.    Dispense:  118 mL    Refill:  0   predniSONE (DELTASONE) 20 MG tablet    Sig: Take 2 tablets (40 mg total) by mouth daily with breakfast for 5 days.    Dispense:  10 tablet    Refill:  0      *This clinic note was created using Scientist, clinical (histocompatibility and immunogenetics). Therefore, there may be occasional mistakes despite careful proofreading.  ?    Domenick Gong, MD 07/13/23 [id number]

## 2023-07-20 ENCOUNTER — Ambulatory Visit: Payer: Medicaid Other | Admitting: Family Medicine

## 2023-07-20 ENCOUNTER — Encounter: Payer: Self-pay | Admitting: Family Medicine

## 2023-07-20 VITALS — BP 118/80 | HR 104 | Ht 67.0 in | Wt 175.0 lb

## 2023-07-20 DIAGNOSIS — L989 Disorder of the skin and subcutaneous tissue, unspecified: Secondary | ICD-10-CM | POA: Diagnosis not present

## 2023-07-20 DIAGNOSIS — F418 Other specified anxiety disorders: Secondary | ICD-10-CM | POA: Diagnosis not present

## 2023-07-20 DIAGNOSIS — G4709 Other insomnia: Secondary | ICD-10-CM | POA: Diagnosis not present

## 2023-07-20 NOTE — Assessment & Plan Note (Signed)
Trinese also reports a growing mark on her abdomen that she has been monitoring for a few years. She states that it has gradually increased in size and darkened in color. She has a history of a similar mark that was previously removed from her head.   Document Information  Photos  Left anterolateral abdomen  07/20/2023 09:10  Attached To:  Office Visit on 07/20/23 with Jerrol Banana, MD  Source Information  Jerrol Banana, MD  [facility]  Document History     Skin Lesion Patient reports a growing, darkening lesion on abdomen over the past few years. Previous similar lesion was removed. -Refer to Dermatology for evaluation of skin lesion.

## 2023-07-20 NOTE — Patient Instructions (Signed)
VISIT SUMMARY:  During today's visit, we discussed your ongoing sleep disturbances, increased stress levels, and a growing mark on your abdomen. We reviewed your current medications and made some adjustments to better manage your symptoms. Additionally, we planned referrals to specialists for further evaluation and treatment.  YOUR PLAN:  -STRESS AND ANXIETY: Stress and anxiety can cause feelings of worry and unease. You are currently taking Sertraline, which has shown some improvement. We recommend continuing with your current dose and considering therapy. A referral to Psychiatry has been made for further evaluation and potential cognitive behavioral therapy.  -INSOMNIA: Insomnia is difficulty falling or staying asleep. You are currently taking Onfi and Clonazepam. We proposed adding Lunesta 1mg  for sleep, pending approval from your neurologist. We will check in with you regarding the neurologist's response and prescribe Lunesta if approved.  -SKIN LESION: A skin lesion is an abnormal growth or mark on the skin. You have reported a growing, darkening lesion on your abdomen. We have referred you to Dermatology for further evaluation.  -GENERAL HEALTH MAINTENANCE: For your general health, continue with your Depo shot, with your next appointment on December 27. You have an endocrinology appointment on December 30 to follow up on your thyroid ultrasound results, and a cardiology follow-up after your echo and heart monitor results.  INSTRUCTIONS:  Please follow up with Psychiatry for further evaluation of your stress and anxiety. Check in with [place] regarding your neurologist's response about adding Lunesta for sleep. Attend your dermatology appointment for the evaluation of your skin lesion. Continue with your Depo shot, and attend your endocrinology and cardiology follow-up appointments as scheduled.

## 2023-07-20 NOTE — Progress Notes (Signed)
Primary Care / Sports Medicine Office Visit  Patient Information:  Patient ID: Jill Woodard, female DOB: 2001/05/02 Age: 22 y.o. MRN: [id number]   Jill Woodard is a pleasant 22 y.o. female presenting with the following:  Chief Complaint  Patient presents with   Depression    Patient is here today to discuss her anxiety and depression, she states since her last visit when her medication dosage was increased she has felt better and feels that her symptoms are more controlled.    Vitals:   07/20/23 0839  BP: 118/80  Pulse: (!) 104  SpO2: 99%   Vitals:   07/20/23 0839  Weight: 175 lb (79.4 kg)  Height: 5\' 7"  (1.702 m)   Body mass index is 27.41 kg/m.     Independent interpretation of notes and tests performed by another provider:   None  Procedures performed:   None  Pertinent History, Exam, Impression, and Recommendations:   Problem List Items Addressed This Visit       Musculoskeletal and Integument   Skin lesion     Daisia also reports a growing mark on her abdomen that she has been monitoring for a few years. She states that it has gradually increased in size and darkened in color. She has a history of a similar mark that was previously removed from her head.   Document Information  Photos  Left anterolateral abdomen  07/20/2023 09:10  Attached To:  Office Visit on 07/20/23 with Jerrol Banana, MD  Source Information  Jerrol Banana, MD  [facility]  Document History     Skin Lesion Patient reports a growing, darkening lesion on abdomen over the past few years. Previous similar lesion was removed. -Refer to Dermatology for evaluation of skin lesion.      Relevant Orders   Ambulatory referral to Dermatology     Other   Depression with anxiety    Despite an increase in her sertraline dosage a month ago, she continues to experience stress issues. However, she does note some improvement in her stress levels since  the dosage increase.  Stress and Anxiety Increased stress due to upcoming medical appointments and concerns about thyroid nodule. Currently on Sertraline with some improvement noted. Patient is considering therapy. -Continue Sertraline at current dose. -Refer to Psychiatry for further evaluation and potential cognitive behavioral therapy.      Relevant Orders   Ambulatory referral to Psychiatry   Other insomnia - Primary    She reports that her sleep is frequently interrupted, often waking up between 1-3am and staying awake for 1-2 hours before falling back asleep. She attributes her sleep issues to her current stress levels, which have been exacerbated by upcoming medical appointments and tests.  Insomnia Difficulty sleeping, waking up in the middle of the night and staying awake for 1-2 hours. Currently on Onfi 15mg  in the morning and 20mg  at night, and Clonazepam 0.25mg  twice a day. -Proposed Lunesta 1mg  for sleep, pending approval from neurologist due to seizure history. -Check in with patient regarding neurologist's response and prescribe Lunesta if approved.        Orders & Medications Medications: No orders of the defined types were placed in this encounter.  Orders Placed This Encounter  Procedures   Ambulatory referral to Psychiatry   Ambulatory referral to Dermatology     No follow-ups on file.     Jerrol Banana, MD, [facility]   Primary Care Sports Medicine Primary Care and  Sports Medicine at [employer]

## 2023-07-20 NOTE — Assessment & Plan Note (Signed)
She reports that her sleep is frequently interrupted, often waking up between 1-3am and staying awake for 1-2 hours before falling back asleep. She attributes her sleep issues to her current stress levels, which have been exacerbated by upcoming medical appointments and tests.  Insomnia Difficulty sleeping, waking up in the middle of the night and staying awake for 1-2 hours. Currently on Onfi 15mg  in the morning and 20mg  at night, and Clonazepam 0.25mg  twice a day. -Proposed Lunesta 1mg  for sleep, pending approval from neurologist due to seizure history. -Check in with patient regarding neurologist's response and prescribe Lunesta if approved.

## 2023-07-20 NOTE — Assessment & Plan Note (Signed)
Despite an increase in her sertraline dosage a month ago, she continues to experience stress issues. However, she does note some improvement in her stress levels since the dosage increase.  Stress and Anxiety Increased stress due to upcoming medical appointments and concerns about thyroid nodule. Currently on Sertraline with some improvement noted. Patient is considering therapy. -Continue Sertraline at current dose. -Refer to Psychiatry for further evaluation and potential cognitive behavioral therapy.

## 2023-07-21 ENCOUNTER — Other Ambulatory Visit: Payer: Self-pay

## 2023-07-21 ENCOUNTER — Other Ambulatory Visit: Payer: Self-pay | Admitting: Family Medicine

## 2023-07-21 DIAGNOSIS — K2100 Gastro-esophageal reflux disease with esophagitis, without bleeding: Secondary | ICD-10-CM

## 2023-07-21 MED ORDER — METOPROLOL SUCCINATE ER 25 MG PO TB24: 12.5000 mg | ORAL_TABLET | Freq: Every day | ORAL | 0 refills | Status: DC

## 2023-07-24 ENCOUNTER — Encounter: Payer: Self-pay | Admitting: Family Medicine

## 2023-07-24 NOTE — Telephone Encounter (Signed)
Please review.  KP

## 2023-07-24 NOTE — Telephone Encounter (Signed)
Requested Prescriptions  Pending Prescriptions Disp Refills   pantoprazole (PROTONIX) 40 MG tablet [Pharmacy Med Name: PANTOPRAZOLE SODIUM 40 MG DR TAB] 90 tablet 1    Sig: TAKE 1 TABLET BY MOUTH ONCE DAILY AS NEEDED FOR REFLUX     Gastroenterology: Proton Pump Inhibitors Passed - 07/21/2023  6:22 PM      Passed - Valid encounter within last 12 months    Recent Outpatient Visits           4 days ago Other insomnia   [place] [facility], Ocie Bob, MD   1 month ago Depression with anxiety   [place] [facility], Ocie Bob, MD   2 months ago Depression with anxiety   [facility] at [facility], Ocie Bob, MD   3 months ago Healthcare maintenance   [facility] at [facility], Ocie Bob, MD   5 months ago Cervical radicular pain   [place] [facility] at [facility], Ocie Bob, MD       Future Appointments             In 3 weeks Agbor-Etang, Arlys John, MD [facility] at [facility]

## 2023-07-25 ENCOUNTER — Other Ambulatory Visit: Payer: Self-pay | Admitting: Family Medicine

## 2023-07-25 DIAGNOSIS — G4709 Other insomnia: Secondary | ICD-10-CM

## 2023-07-25 MED ORDER — TRAZODONE HCL 50 MG PO TABS: 25.0000 mg | ORAL_TABLET | Freq: Every evening | ORAL | 0 refills | Status: DC | PRN

## 2023-07-25 MED ORDER — ESZOPICLONE 1 MG PO TABS: 1.0000 mg | ORAL_TABLET | Freq: Every evening | ORAL | 0 refills | Status: DC | PRN

## 2023-07-27 DIAGNOSIS — D225 Melanocytic nevi of trunk: Secondary | ICD-10-CM | POA: Diagnosis not present

## 2023-07-28 ENCOUNTER — Encounter (INDEPENDENT_AMBULATORY_CARE_PROVIDER_SITE_OTHER): Payer: Self-pay

## 2023-07-31 DIAGNOSIS — E041 Nontoxic single thyroid nodule: Secondary | ICD-10-CM | POA: Diagnosis not present

## 2023-08-11 ENCOUNTER — Ambulatory Visit: Payer: Medicaid Other

## 2023-08-11 VITALS — BP 119/70 | Ht 67.0 in | Wt 177.5 lb

## 2023-08-11 DIAGNOSIS — Z3042 Encounter for surveillance of injectable contraceptive: Secondary | ICD-10-CM

## 2023-08-11 DIAGNOSIS — Z309 Encounter for contraceptive management, unspecified: Secondary | ICD-10-CM

## 2023-08-11 DIAGNOSIS — Z30013 Encounter for initial prescription of injectable contraceptive: Secondary | ICD-10-CM | POA: Diagnosis not present

## 2023-08-11 DIAGNOSIS — Z3009 Encounter for other general counseling and advice on contraception: Secondary | ICD-10-CM

## 2023-08-11 NOTE — Progress Notes (Signed)
11 weeks 3 days post depo.  Denies problems with method and desires to continue.  States has nodule on thyroid and has appt for biopsy in February.  See flowsheet.  Depo given IM LUOQ per order by Aliene Altes FNP dated 02/13/23; tolerated well.  Next depo due 10/27/23; appt reminder given.  Cherlynn Polo, RN

## 2023-08-14 ENCOUNTER — Encounter: Payer: Self-pay | Admitting: Cardiology

## 2023-08-14 ENCOUNTER — Other Ambulatory Visit: Payer: Self-pay | Admitting: Family Medicine

## 2023-08-14 ENCOUNTER — Ambulatory Visit: Payer: Medicaid Other | Attending: Cardiology | Admitting: Cardiology

## 2023-08-14 VITALS — BP 104/76 | HR 99 | Ht 62.0 in | Wt 179.0 lb

## 2023-08-14 DIAGNOSIS — I4711 Inappropriate sinus tachycardia, so stated: Secondary | ICD-10-CM

## 2023-08-14 DIAGNOSIS — F418 Other specified anxiety disorders: Secondary | ICD-10-CM

## 2023-08-14 DIAGNOSIS — R000 Tachycardia, unspecified: Secondary | ICD-10-CM

## 2023-08-14 MED ORDER — METOPROLOL SUCCINATE ER 25 MG PO TB24: 25.0000 mg | ORAL_TABLET | Freq: Every day | ORAL | 0 refills | Status: DC

## 2023-08-14 NOTE — Patient Instructions (Signed)
Medication Instructions:   INCREASE Metoprolol Succinate - Take one tablet ( 25mg ) by mouth daily.   - If after a month your heart rate is still elevated you can increase the metoprolol to one tablet twice a day. If you do need to increase the medication please call or send [place] a mychart message so we can update this in your chart.   *If you need a refill on your cardiac medications before your next appointment, please call your pharmacy*   Lab Work:  None Ordered  If you have labs (blood work) drawn today and your tests are completely normal, you will receive your results only by: MyChart Message (if you have MyChart) OR A paper copy in the mail If you have any lab test that is abnormal or we need to change your treatment, we will call you to review the results.   Testing/Procedures:  None Ordered    Follow-Up: At [facility], you and your health needs are our priority.  As part of our continuing mission to provide you with exceptional heart care, we have created designated Provider Care Teams.  These Care Teams include your primary Cardiologist (physician) and Advanced Practice Providers (APPs -  Physician Assistants and Nurse Practitioners) who all work together to provide you with the care you need, when you need it.  We recommend signing up for the patient portal called "MyChart".  Sign up information is provided on this After Visit Summary.  MyChart is used to connect with patients for Virtual Visits (Telemedicine).  Patients are able to view lab/test results, encounter notes, upcoming appointments, etc.  Non-urgent messages can be sent to your provider as well.   To learn more about what you can do with MyChart, go to [email or web address].    Your next appointment:   5 month(s)  Provider:   You may see Debbe Odea, MD or one of the following Advanced Practice Providers on your designated Care Team:   Nicolasa Ducking, NP Eula Listen, PA-C Cadence Fransico Michael,  PA-C Charlsie Quest, NP Carlos Levering, NP

## 2023-08-14 NOTE — Progress Notes (Signed)
Cardiology Office Note:    Date:  08/14/2023   ID:  Jill Woodard, DOB 07-08-2001, MRN [id number]  PCP:  Jerrol Banana, MD   [facility] Providers Cardiologist:  Debbe Odea, MD     Referring MD: Jerrol Banana, MD   Chief Complaint  Patient presents with   Follow-up    Advised on Zio results to start low-dose Toprol-XL 12.5 mg daily to help with elevated average heart rate.  Tolerating medication but has not noticed a change in heart rate.      History of Present Illness:    Jill Woodard is a 22 y.o. female with a hx of seizures, anxiety, depression, thyromegaly presenting for follow-up.  Previously seen due to elevated heart rates.  Cardiac monitor was placed to evaluate any significant arrhythmias, average heart rate noted to be 103.  Started on Toprol-XL 12.5 mg daily, tolerating medications.  No adverse effects.  States heart rates are still elevated at home.  States having episodes where she felt dizzy and passed out, heart rate was 140.  Unsure if seizure activity is contributing to elevated heart rates.  Prior notes/testing Echocardiogram 04/2023 EF 50%  Past Medical History:  Diagnosis Date   Allergy    Anxiety    Asthma    Depression with anxiety 01/03/2023   Epilepsy (HCC)    Healthcare maintenance 04/19/2023   Herpes    Seizures (HCC)    Thyromegaly 04/19/2023    Past Surgical History:  Procedure Laterality Date   ADENOIDECTOMY     BRAIN SURGERY     FINGER FRACTURE SURGERY     TYMPANOSTOMY TUBE PLACEMENT      Current Medications: Current Meds  Medication Sig   albuterol (PROVENTIL) (2.5 MG/3ML) 0.083% nebulizer solution Inhale into the lungs.   albuterol (VENTOLIN HFA) 108 (90 Base) MCG/ACT inhaler Inhale 1-2 puffs into the lungs every 6 (six) hours as needed for wheezing or shortness of breath.   chlorhexidine (PERIDEX) 0.12 % solution Use as directed 15 mLs in the mouth or throat daily.   cloBAZam (ONFI PO) Take 15  mg by mouth in the morning.   cloBAZam (ONFI) 10 MG tablet Take 10 mg by mouth at bedtime.   clonazePAM (KLONOPIN) 0.5 MG tablet Take 0.25 mg by mouth 2 (two) times daily. (1/2) of 0.5 mg tablet BID   cyclobenzaprine (FLEXERIL) 5 MG tablet TAKE 1 TABLET BY MOUTH 3 TIMES DAILY AS NEEDED FOR MUSCLE SPASMS   lamoTRIgine (LAMICTAL) 100 MG tablet Take by mouth 2 (two) times daily.   meloxicam (MOBIC) 15 MG tablet Take 15 mg by mouth at bedtime.   Multiple Vitamins-Minerals (MULTIVITAL PO) Take by mouth daily.   NAYZILAM 5 MG/0.1ML SOLN 3 times/day as needed-between meals & bedtime.   pantoprazole (PROTONIX) 40 MG tablet TAKE 1 TABLET BY MOUTH ONCE DAILY AS NEEDED FOR REFLUX   sertraline (ZOLOFT) 50 MG tablet Take 1 tablet (50 mg total) by mouth daily. (Patient taking differently: Take 50 mg by mouth at bedtime.)   traZODone (DESYREL) 50 MG tablet Take 0.5-1 tablets (25-50 mg total) by mouth at bedtime as needed for sleep.   triamcinolone (KENALOG) 0.1 % paste Use as directed 1 Application in the mouth or throat daily.   [DISCONTINUED] metoprolol succinate (TOPROL XL) 25 MG 24 hr tablet Take 0.5 tablets (12.5 mg total) by mouth daily.   Current Facility-Administered Medications for the 08/14/23 encounter (Office Visit) with Debbe Odea, MD  Medication   medroxyPROGESTERone (  DEPO-PROVERA) injection 150 mg     Allergies:   Amoxicillin, Dust mite extract, Other, Grapefruit concentrate, Grapefruit extract, Vancomycin, Voltaren [diclofenac sodium], and Wound dressings   Social History   Socioeconomic History   Marital status: Single    Spouse name: Not on file   Number of children: Not on file   Years of education: Not on file   Highest education level: Some college, no degree  Occupational History   Not on file  Tobacco Use   Smoking status: Never    Passive exposure: Never   Smokeless tobacco: Not on file  Vaping Use   Vaping status: Never Used  Substance and Sexual Activity    Alcohol use: No   Drug use: No   Sexual activity: Yes    Partners: Male    Birth control/protection: Injection, OCP  Other Topics Concern   Not on file  Social History Narrative   Not on file   Social Drivers of Health   Financial Resource Strain: Low Risk  (07/18/2023)   Overall Financial Resource Strain (CARDIA)    Difficulty of Paying Living Expenses: Not hard at all  Recent Concern: Financial Resource Strain - Medium Risk (06/23/2023)   Received from [facility]   Overall Financial Resource Strain (CARDIA)    Difficulty of Paying Living Expenses: Somewhat hard  Food Insecurity: No Food Insecurity (07/18/2023)   Hunger Vital Sign    Worried About Running Out of Food in the Last Year: Never true    Ran Out of Food in the Last Year: Never true  Transportation Needs: No Transportation Needs (07/18/2023)   PRAPARE - Administrator, Civil Service (Medical): No    Lack of Transportation (Non-Medical): No  Recent Concern: Transportation Needs - Unmet Transportation Needs (06/23/2023)   Received from [facility] - Transportation    In the past 12 months, has lack of transportation kept you from medical appointments or from getting medications?: Yes    Lack of Transportation (Non-Medical): No  Physical Activity: Insufficiently Active (07/18/2023)   Exercise Vital Sign    Days of Exercise per Week: 1 day    Minutes of Exercise per Session: 10 min  Stress: Stress Concern Present (07/18/2023)   [employer] of Occupational Health - Occupational Stress Questionnaire    Feeling of Stress : To some extent  Social Connections: Socially Isolated (07/18/2023)   Social Connection and Isolation Panel [NHANES]    Frequency of Communication with Friends and Family: More than three times a week    Frequency of Social Gatherings with Friends and Family: More than three times a week    Attends Religious Services: Never    Doctor, general practice or Organizations: No    Attends Engineer, structural: Not on file    Marital Status: Never married     Family History: The patient's family history includes ADD / ADHD in her sister; Alcohol abuse in her father; Anxiety disorder in her mother and sister; Arthritis in her maternal grandmother and mother; Asthma in her maternal grandmother; Cancer in her maternal grandfather, mother, and paternal grandfather; Depression in her sister; Diabetes in her maternal grandmother; Drug abuse in her father; Hearing loss in her maternal grandmother; Obesity in her maternal grandmother and mother; Ovarian cysts in her mother; Supraventricular tachycardia in her sister; Varicose Veins in her maternal grandfather and mother; Vision loss in her sister.  ROS:   Please see the  history of present illness.     All other systems reviewed and are negative.  EKGs/Labs/Other Studies Reviewed:    The following studies were reviewed today:       Recent Labs: 04/19/2023: ALT 20; TSH 1.110; TSH 1.040 04/25/2023: BUN 9; Creatinine, Ser 0.67; Hemoglobin 12.2; Platelets 439; Potassium 3.3; Sodium 138  Recent Lipid Panel    Component Value Date/Time   CHOL 168 04/19/2023 0000   TRIG 83 04/19/2023 0000   HDL 48 04/19/2023 0000   CHOLHDL 3.5 04/19/2023 0000   LDLCALC 104 (H) 04/19/2023 0000     Risk Assessment/Calculations:             Physical Exam:    VS:  BP 104/76 (BP Location: Left Arm, Patient Position: Sitting, Cuff Size: Large)   Pulse 99   Ht 5\' 2"  (1.575 m)   Wt 179 lb (81.2 kg)   SpO2 99%   BMI 32.74 kg/m     Wt Readings from Last 3 Encounters:  08/14/23 179 lb (81.2 kg)  08/11/23 177 lb 8 oz (80.5 kg)  07/20/23 175 lb (79.4 kg)     GEN:  Well nourished, well developed in no acute distress HEENT: Normal NECK: No JVD; No carotid bruits CARDIAC: RRR, no murmurs, rubs, gallops RESPIRATORY:  Clear to auscultation without rales, wheezing or rhonchi  ABDOMEN: Soft, non-tender,  non-distended MUSCULOSKELETAL:  No edema; No deformity  SKIN: Warm and dry NEUROLOGIC:  Alert and oriented x 3 PSYCHIATRIC:  Normal affect   ASSESSMENT:    1. Tachycardia   2. Inappropriate sinus tachycardia (HCC)     PLAN:    In order of problems listed above:  Elevated heart rates, cardiac monitor 12/24 average heart rate 103, minimal heart rate 56, max heart rate 176.  Has inappropriate sinus tachycardia, no arrhythmias noted.  Echo 9/24 EF 50%.  EKG today heart rate 99.  Increase Toprol-XL to 25 mg daily.  Goal average heart rate less than 100.  Okay to increase Toprol-XL as needed if BP permits to achieve goal heart rate.  Follow-up in 5 months.       Medication Adjustments/Labs and Tests Ordered: Current medicines are reviewed at length with the patient today.  Concerns regarding medicines are outlined above.  No orders of the defined types were placed in this encounter.  Meds ordered this encounter  Medications   metoprolol succinate (TOPROL XL) 25 MG 24 hr tablet    Sig: Take 1 tablet (25 mg total) by mouth daily.    Dispense:  90 tablet    Refill:  0    Patient Instructions  Medication Instructions:   INCREASE Metoprolol Succinate - Take one tablet ( 25mg ) by mouth daily.   - If after a month your heart rate is still elevated you can increase the metoprolol to one tablet twice a day. If you do need to increase the medication please call or send [place] a mychart message so we can update this in your chart.   *If you need a refill on your cardiac medications before your next appointment, please call your pharmacy*   Lab Work:  None Ordered  If you have labs (blood work) drawn today and your tests are completely normal, you will receive your results only by: MyChart Message (if you have MyChart) OR A paper copy in the mail If you have any lab test that is abnormal or we need to change your treatment, we will call you to review the  results.  Testing/Procedures:  None Ordered    Follow-Up: At [facility], you and your health needs are our priority.  As part of our continuing mission to provide you with exceptional heart care, we have created designated Provider Care Teams.  These Care Teams include your primary Cardiologist (physician) and Advanced Practice Providers (APPs -  Physician Assistants and Nurse Practitioners) who all work together to provide you with the care you need, when you need it.  We recommend signing up for the patient portal called "MyChart".  Sign up information is provided on this After Visit Summary.  MyChart is used to connect with patients for Virtual Visits (Telemedicine).  Patients are able to view lab/test results, encounter notes, upcoming appointments, etc.  Non-urgent messages can be sent to your provider as well.   To learn more about what you can do with MyChart, go to [email or web address].    Your next appointment:   5 month(s)  Provider:   You may see Debbe Odea, MD or one of the following Advanced Practice Providers on your designated Care Team:   Nicolasa Ducking, NP Eula Listen, PA-C Cadence Fransico Michael, PA-C Charlsie Quest, NP Carlos Levering, NP    Signed, Debbe Odea, MD  08/14/2023 11:57 AM    [facility]

## 2023-08-18 NOTE — Telephone Encounter (Signed)
 Requested Prescriptions  Pending Prescriptions Disp Refills   sertraline  (ZOLOFT ) 50 MG tablet [Pharmacy Med Name: SERTRALINE  HCL 50 MG TAB] 90 tablet 0    Sig: TAKE 1 TABLET BY MOUTH ONCE DAILY     Psychiatry:  Antidepressants - SSRI - sertraline  Passed - 08/18/2023  2:52 PM      Passed - AST in normal range and within 360 days    AST  Date Value Ref Range Status  04/19/2023 19 0 - 40 IU/L Final         Passed - ALT in normal range and within 360 days    ALT  Date Value Ref Range Status  04/19/2023 20 0 - 32 IU/L Final         Passed - Completed PHQ-2 or PHQ-9 in the last 360 days      Passed - Valid encounter within last 6 months    Recent Outpatient Visits           4 weeks ago Other insomnia   [place] [facility], Selinda PARAS, MD   2 months ago Depression with anxiety   [facility], Selinda PARAS, MD   3 months ago Depression with anxiety   [facility] at [facility], Selinda PARAS, MD   4 months ago Healthcare maintenance   [facility] at [facility], Selinda PARAS, MD   6 months ago Cervical radicular pain   [facility] at [facility], Selinda PARAS, MD

## 2023-09-12 ENCOUNTER — Other Ambulatory Visit: Payer: Self-pay | Admitting: Medical Genetics

## 2023-09-18 ENCOUNTER — Other Ambulatory Visit
Admission: RE | Admit: 2023-09-18 | Discharge: 2023-09-18 | Disposition: A | Discharge status: Home / Self Care | Payer: Medicaid Other | Source: Ambulatory Visit | Attending: Oncology | Admitting: Oncology

## 2023-09-18 DIAGNOSIS — D235 Other benign neoplasm of skin of trunk: Secondary | ICD-10-CM | POA: Diagnosis not present

## 2023-10-01 LAB — GENECONNECT MOLECULAR SCREEN: Genetic Analysis Overall Interpretation: NEGATIVE

## 2023-10-03 DIAGNOSIS — D485 Neoplasm of uncertain behavior of skin: Secondary | ICD-10-CM | POA: Diagnosis not present

## 2023-10-06 ENCOUNTER — Other Ambulatory Visit: Payer: Self-pay | Admitting: Family Medicine

## 2023-10-06 DIAGNOSIS — G4709 Other insomnia: Secondary | ICD-10-CM

## 2023-10-09 NOTE — Telephone Encounter (Signed)
 Requested Prescriptions  Pending Prescriptions Disp Refills   traZODone (DESYREL) 50 MG tablet [Pharmacy Med Name: TRAZODONE HCL 50 MG TAB] 30 tablet 0    Sig: TAKE 1/2-1 TABLET BY MOUTH AT BEDTIME ASNEEDED FOR SLEEP     Psychiatry: Antidepressants - Serotonin Modulator Passed - 10/09/2023 11:15 AM      Passed - Completed PHQ-2 or PHQ-9 in the last 360 days      Passed - Valid encounter within last 6 months    Recent Outpatient Visits           2 months ago Other insomnia   [facility], Ocie Bob, MD   3 months ago Depression with anxiety   [facility] at [facility], Ocie Bob, MD   5 months ago Depression with anxiety   [facility] at [facility], Ocie Bob, MD   5 months ago Healthcare maintenance   [facility] at [facility], Ocie Bob, MD   7 months ago Cervical radicular pain   [facility] at [facility], Ocie Bob, MD

## 2023-10-11 DIAGNOSIS — E041 Nontoxic single thyroid nodule: Secondary | ICD-10-CM | POA: Diagnosis not present

## 2023-10-26 DIAGNOSIS — D235 Other benign neoplasm of skin of trunk: Secondary | ICD-10-CM | POA: Diagnosis not present

## 2023-10-27 ENCOUNTER — Ambulatory Visit: Payer: Medicaid Other

## 2023-10-27 VITALS — BP 120/70 | Wt 179.0 lb

## 2023-10-27 DIAGNOSIS — Z3042 Encounter for surveillance of injectable contraceptive: Secondary | ICD-10-CM

## 2023-10-27 DIAGNOSIS — Z30013 Encounter for initial prescription of injectable contraceptive: Secondary | ICD-10-CM | POA: Diagnosis not present

## 2023-10-27 DIAGNOSIS — Z309 Encounter for contraceptive management, unspecified: Secondary | ICD-10-CM

## 2023-10-27 DIAGNOSIS — Z3009 Encounter for other general counseling and advice on contraception: Secondary | ICD-10-CM

## 2023-10-27 NOTE — Progress Notes (Signed)
 11 Weeks   0 Days since last Depo   Counseled to adhere to 11 to 13 week intervals between depo injections for optimal benefit.  Depo given today per order by Aliene Altes, FNP  dated 02/19/2023.  Tolerated well L delt.  Next depo due 01/12/2024,  has reminder card.  Jerel Shepherd, RN

## 2023-11-10 ENCOUNTER — Encounter: Payer: Self-pay | Admitting: Family Medicine

## 2023-11-10 NOTE — Telephone Encounter (Signed)
 Please review

## 2023-11-10 NOTE — Telephone Encounter (Signed)
 Please review and advise if  you will send wegovy in for patient.

## 2023-11-13 ENCOUNTER — Other Ambulatory Visit: Payer: Self-pay | Admitting: Family Medicine

## 2023-11-13 DIAGNOSIS — Z6832 Body mass index (BMI) 32.0-32.9, adult: Secondary | ICD-10-CM

## 2023-11-13 MED ORDER — WEGOVY 0.25 MG/0.5ML ~~LOC~~ SOAJ: 0.2500 mg | SUBCUTANEOUS | 2 refills | Status: DC

## 2023-11-15 DIAGNOSIS — R569 Unspecified convulsions: Secondary | ICD-10-CM | POA: Diagnosis not present

## 2023-11-17 ENCOUNTER — Encounter: Payer: Self-pay | Admitting: Family Medicine

## 2023-11-19 ENCOUNTER — Encounter: Payer: Self-pay | Admitting: Family Medicine

## 2023-11-20 ENCOUNTER — Other Ambulatory Visit: Payer: Self-pay

## 2023-11-20 DIAGNOSIS — G4709 Other insomnia: Secondary | ICD-10-CM

## 2023-11-20 MED ORDER — TRAZODONE HCL 50 MG PO TABS: 50.0000 mg | ORAL_TABLET | Freq: Every evening | ORAL | 0 refills | Status: DC

## 2023-11-22 ENCOUNTER — Encounter: Payer: Self-pay | Admitting: Family Medicine

## 2023-11-22 ENCOUNTER — Ambulatory Visit: Admitting: Family Medicine

## 2023-11-22 VITALS — BP 114/88 | HR 91 | Ht 62.0 in | Wt 181.0 lb

## 2023-11-22 DIAGNOSIS — K219 Gastro-esophageal reflux disease without esophagitis: Secondary | ICD-10-CM | POA: Diagnosis not present

## 2023-11-22 DIAGNOSIS — K121 Other forms of stomatitis: Secondary | ICD-10-CM | POA: Diagnosis not present

## 2023-11-22 HISTORY — DX: Other forms of stomatitis: K12.1

## 2023-11-22 HISTORY — DX: Gastro-esophageal reflux disease without esophagitis: K21.9

## 2023-11-22 MED ORDER — VALACYCLOVIR HCL 1 G PO TABS: 1000.0000 mg | ORAL_TABLET | Freq: Two times a day (BID) | ORAL | 2 refills | Status: DC

## 2023-11-22 MED ORDER — LIDOCAINE VISCOUS HCL 2 % MT SOLN: 15.0000 mL | OROMUCOSAL | 0 refills | Status: AC | PRN

## 2023-11-22 NOTE — Assessment & Plan Note (Signed)
 History of Present Illness Jill Woodard is a 23 year old female with a history of recurrent mouth ulcers who presents with persistent oral ulcers. She is accompanied by her mother.  She developed mouth ulcers on April 4th, following dental work on April 3rd. Despite using a previously prescribed steroid oral paste and chlorhexidine mouthwash, the ulcers persisted to this date. These treatments were from a previous urgent care visit for a similar issue.  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 Previous treatments included triamcinolone paste and chlorhexidine mouthwash, neither of which have been effective in resolving the current ulcers. She also mentions a past prescription of Valtrex from March 2024 for a genital herpes outbreak.  She has not experienced any genital lesions since a flare-up in March 2024. 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  Physical Exam HEENT: Aphthous appearing ulcer at right lower gum (see image). No perioral lesions.   Media Information  Document Information  Photos  Lower mouth  11/22/2023 09:07  Attached To:  Office Visit on 11/22/23 with Jerrol Banana, MD  Source Information  Jerrol Banana, MD  [facility]  Document History     Results LABS HSV: Positive (11/04/2022)  Assessment and Plan Aphthous ulcers Persistent oral ulcers post-dental work. Differential includes aphthous ulcers, herpetic lesions, and irritation. Suspected viral component due to past positive HSV-1 test. Acid reflux considered but unresolved with treatment. - Prescribed Valacyclovir for potential viral component. - Prescribed lidocaine for pain control. - Advised hydration to maintain oral moisture. - Referred to gastroenterology for acid reflux and potential IBD evaluation.  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 HSV-1 etiology  considered. - Prescribed Valacyclovir for potential HSV-1 involvement. - Take 1 tablet twice daily x 7 days - Can use viscous lidocaine as-needed

## 2023-11-22 NOTE — Assessment & Plan Note (Signed)
 She has a history of acid reflux and has been on pantoprazole 40 mg daily for it, which she believes might be related to her mouth ulcers. She experiences upset stomach and acid reflux symptoms, which she discussed with her mother, who also has similar symptoms.  Gastroesophageal reflux disease (GERD) Acid reflux may contribute to oral ulcers. Previous treatment ineffective, suggesting GERD may not be sole cause. - Referred to gastroenterology for GERD and potential IBD evaluation.

## 2023-11-22 NOTE — Progress Notes (Unsigned)
 Primary Care / Sports Medicine Office Visit  Patient Information:  Patient ID: Jill Woodard, female DOB: 2001-01-01 Age: 23 y.o. MRN: [id number]   Jill Woodard is a pleasant 23 y.o. female presenting with the following:  Chief Complaint  Patient presents with   Mouth Lesions    Mouth sore on the bottom part of mouth under tongue. Sore came up on Friday  11/17/23. Sore is painful, patient has been using medicated paste and medicated mouth wash but this is  not helping.     Vitals:   11/22/23 0840  BP: 114/88  Pulse: 91  SpO2: 99%   Vitals:   11/22/23 0840  Weight: 181 lb (82.1 kg)  Height: 5\' 2"  (1.575 m)   Body mass index is 33.11 kg/m.  No results found.   Independent interpretation of notes and tests performed by another provider:   None  Procedures performed:   None  Pertinent History, Exam, Impression, and Recommendations:   Problem List Items Addressed This Visit     Gastroesophageal reflux disease   She has a history of acid reflux and has been on pantoprazole 40 mg daily for it, which she believes might be related to her mouth ulcers. She experiences upset stomach and acid reflux symptoms, which she discussed with her mother, who also has similar symptoms.  Gastroesophageal reflux disease (GERD) Acid reflux may contribute to oral ulcers. Previous treatment ineffective, suggesting GERD may not be sole cause. - Referred to gastroenterology for GERD and potential IBD evaluation.      Relevant Orders   Ambulatory referral to Gastroenterology   Oral ulceration - Primary   History of Present Illness Jill Woodard is a 23 year old female with a history of recurrent mouth ulcers who presents with persistent oral ulcers. She is accompanied by her mother.  She developed mouth ulcers on April 4th, following dental work on April 3rd. Despite using a previously prescribed steroid oral paste and chlorhexidine mouthwash, the ulcers  persisted to this date. These treatments were from a previous urgent care visit for a similar issue.  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 Previous treatments included triamcinolone paste and chlorhexidine mouthwash, neither of which have been effective in resolving the current ulcers. She also mentions a past prescription of Valtrex from March 2024 for a genital herpes outbreak.  She has not experienced any genital lesions since a flare-up in March 2024. 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  Physical Exam HEENT: Aphthous appearing ulcer at right lower gum (see image). No perioral lesions.   Media Information  Document Information  Photos  Lower mouth  11/22/2023 09:07  Attached To:  Office Visit on 11/22/23 with Jerrol Banana, MD  Source Information  Jerrol Banana, MD  [facility]  Document History     Results LABS HSV: Positive (11/04/2022)  Assessment and Plan Aphthous ulcers Persistent oral ulcers post-dental work. Differential includes aphthous ulcers, herpetic lesions, and irritation. Suspected viral component due to past positive HSV-1 test. Acid reflux considered but unresolved with treatment. - Prescribed Valacyclovir for potential viral component. - Prescribed lidocaine for pain control. - Advised hydration to maintain oral moisture. - Referred to gastroenterology for acid reflux and potential IBD evaluation.  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 HSV-1 etiology considered. - Prescribed Valacyclovir for potential HSV-1  involvement. - Take 1 tablet twice daily x 7 days - Can use viscous lidocaine as-needed      Relevant Medications   valACYclovir (VALTREX) 1000 MG tablet   lidocaine (XYLOCAINE) 2 % solution   Other Relevant Orders   Ambulatory referral to Gastroenterology     Orders & Medications Medications:  Meds  ordered this encounter  Medications   valACYclovir (VALTREX) 1000 MG tablet    Sig: Take 1 tablet (1,000 mg total) by mouth 2 (two) times daily.    Dispense:  14 tablet    Refill:  2   lidocaine (XYLOCAINE) 2 % solution    Sig: Use as directed 15 mLs in the mouth or throat as needed for mouth pain.    Dispense:  100 mL    Refill:  0   Orders Placed This Encounter  Procedures   Ambulatory referral to Gastroenterology     Return if symptoms worsen or fail to improve.     Jerrol Banana, MD, [facility]   Primary Care Sports Medicine Primary Care and Sports Medicine at [facility]

## 2023-11-23 NOTE — Patient Instructions (Signed)
 Patient Plan for Post-Visit Guidance  1. Oral Ulcers:    - Take Valacyclovir (Valtrex) 1000 mg, one tablet twice daily for 7 days.    - Use lidocaine solution as needed for pain relief.    - Stay hydrated to help maintain oral moisture.  2. Gastroesophageal Reflux Disease (GERD):    - You have been referred to gastroenterology for further evaluation of GERD and potential inflammatory bowel disease (IBD).  3. Monitoring:    - Watch for any worsening of symptoms or new symptoms such as increased pain, difficulty swallowing, or signs of infection around the ulcers. Seek medical attention if these occur.  Please follow these steps and reach out if you have any questions or need further assistance.

## 2023-12-03 ENCOUNTER — Encounter: Payer: Self-pay | Admitting: Family Medicine

## 2023-12-04 NOTE — Telephone Encounter (Signed)
 For review.  KP

## 2023-12-04 NOTE — Telephone Encounter (Signed)
 Please review. Appt?  KP

## 2023-12-05 ENCOUNTER — Encounter: Payer: Self-pay | Admitting: Family Medicine

## 2023-12-11 ENCOUNTER — Encounter: Payer: Self-pay | Admitting: Family Medicine

## 2023-12-12 NOTE — Telephone Encounter (Signed)
 Please review.  KP

## 2023-12-13 ENCOUNTER — Encounter: Payer: Self-pay | Admitting: Family Medicine

## 2023-12-13 ENCOUNTER — Ambulatory Visit: Admitting: Family Medicine

## 2023-12-13 VITALS — BP 120/80 | HR 112 | Ht 62.0 in | Wt 173.0 lb

## 2023-12-13 DIAGNOSIS — J00 Acute nasopharyngitis [common cold]: Secondary | ICD-10-CM

## 2023-12-13 DIAGNOSIS — K121 Other forms of stomatitis: Secondary | ICD-10-CM | POA: Diagnosis not present

## 2023-12-13 LAB — POCT RAPID STREP A (OFFICE): Rapid Strep A Screen: NEGATIVE

## 2023-12-13 MED ORDER — FLUTICASONE PROPIONATE 50 MCG/ACT NA SUSP: 2.0000 | Freq: Every day | NASAL | 11 refills | Status: AC

## 2023-12-13 MED ORDER — AZITHROMYCIN 250 MG PO TABS: ORAL_TABLET | ORAL | 0 refills | Status: AC

## 2023-12-13 NOTE — Patient Instructions (Signed)
 Patient Plan  Nasopharyngitis: 1. Take azithromycin  as prescribed until the course is complete. 2. Continue using guaifenesin to help with mucus and fluticasone  for nasal symptoms.  Oral Ulcerations: 1. Use lidocaine  mouthwash and perform salt water rinses regularly for relief. 2. Attend the upcoming gastroenterology appointment once scheduled. 3. Follow up with the ENT specialist as referred.  Red Flags: - If you experience increased chest pain, difficulty breathing, or if symptoms worsen, contact your healthcare provider immediately.

## 2023-12-13 NOTE — Progress Notes (Signed)
     Primary Care / Sports Medicine Office Visit  Patient Information:  Patient ID: Jill Woodard, female DOB: [birth year] Age: 23 y.o. MRN: [id number]   Jill Woodard is a pleasant 23 y.o. female presenting with the following:  Chief Complaint  Patient presents with   Sore Throat    Patient having sore throat for the last week. She is having drainage and coughing up green mucus. She has one sore on the roof of her mouth. She is having some difficulty swallowing and swelling in her throat.     Vitals:   12/13/23 1048  BP: 120/80  Pulse: (!) 112  SpO2: 97%   Vitals:   12/13/23 1048  Weight: 173 lb (78.5 kg)  Height: 5\' 2"  (1.575 m)   Body mass index is 31.64 kg/m.  No results found.   Independent interpretation of notes and tests performed by another provider:   None  Procedures performed:   None  Pertinent History, Exam, Impression, and Recommendations:   Problem List Items Addressed This Visit     Nasopharyngitis acute - Primary   History of Present Illness Jill Woodard is a 23 year old female who presents with throat drainage and ongoing oral ulcerations.  She has pain between her shoulder blades and chest tightness, which worsens when bending down. There is difficulty sleeping flat due to a sensation of something being stuck in her throat, along with coughing up mucus. Chest tightness and pain radiate to her back, particularly when inhaling deeply. She feels cold and clammy at times, with tightness in her chest and difficulty breathing deeply. Her oxygen saturation is 97%, lower than her previous baseline of 99%.  Physical Exam VITALS: P- 112, SaO2- 97% HEENT: Oropharynx with mild erythema, no exudate, nasal turbinates swollen and left more so than right erythematous, nontender sinuses.   NECK: No lymphadenopathy CHEST: Lungs clear to auscultation bilaterally without wheezes, rales, rhonchi.  POC rapid strep  negative  Nasopharyngitis Acute throat pain with drainage and nasal symptoms. Differential includes sinusitis, or viral etiology.  Rapid strep negative. - Take azithromycin  for full course. - Continue guaifenesin and fluticasone .      Relevant Medications   azithromycin  (ZITHROMAX ) 250 MG tablet   Other Relevant Orders   POCT rapid strep A (Completed)   Oral ulceration   Oral ulcerations Recurrent oral ulcerations possibly stress-related or viral. Differential includes reflux and gastrointestinal causes. Gastroenterology referral pending. - Expedite gastroenterology appointment. - ENT referral placed. - Continue lidocaine  mouthwash and salt rinses.      Relevant Orders   POCT rapid strep A (Completed)   Ambulatory referral to ENT     Orders & Medications Medications:  Meds ordered this encounter  Medications   azithromycin  (ZITHROMAX ) 250 MG tablet    Sig: Take 2 tablets on day 1, then 1 tablet daily on days 2 through 5    Dispense:  6 tablet    Refill:  0   fluticasone  (FLONASE ) 50 MCG/ACT nasal spray    Sig: Place 2 sprays into both nostrils daily.    Dispense:  16 g    Refill:  11   Orders Placed This Encounter  Procedures   Ambulatory referral to ENT   POCT rapid strep A     No follow-ups on file.     Ma Saupe, MD, [facility]   Primary Care Sports Medicine Primary Care and Sports Medicine at [facility]

## 2023-12-13 NOTE — Assessment & Plan Note (Signed)
 Oral ulcerations Recurrent oral ulcerations possibly stress-related or viral. Differential includes reflux and gastrointestinal causes. Gastroenterology referral pending. - Expedite gastroenterology appointment. - ENT referral placed. - Continue lidocaine  mouthwash and salt rinses.

## 2023-12-13 NOTE — Assessment & Plan Note (Signed)
 History of Present Illness Jill Woodard is a 23 year old female who presents with throat drainage and ongoing oral ulcerations.  She has pain between her shoulder blades and chest tightness, which worsens when bending down. There is difficulty sleeping flat due to a sensation of something being stuck in her throat, along with coughing up mucus. Chest tightness and pain radiate to her back, particularly when inhaling deeply. She feels cold and clammy at times, with tightness in her chest and difficulty breathing deeply. Her oxygen saturation is 97%, lower than her previous baseline of 99%.  Physical Exam VITALS: P- 112, SaO2- 97% HEENT: Oropharynx with mild erythema, no exudate, nasal turbinates swollen and left more so than right erythematous, nontender sinuses.   NECK: No lymphadenopathy CHEST: Lungs clear to auscultation bilaterally without wheezes, rales, rhonchi.  POC rapid strep negative  Nasopharyngitis Acute throat pain with drainage and nasal symptoms. Differential includes sinusitis, or viral etiology.  Rapid strep negative. - Take azithromycin  for full course. - Continue guaifenesin and fluticasone .

## 2023-12-18 ENCOUNTER — Ambulatory Visit

## 2023-12-22 ENCOUNTER — Other Ambulatory Visit: Payer: Self-pay | Admitting: Otolaryngology

## 2023-12-22 DIAGNOSIS — J329 Chronic sinusitis, unspecified: Secondary | ICD-10-CM | POA: Diagnosis not present

## 2023-12-22 DIAGNOSIS — J3801 Paralysis of vocal cords and larynx, unilateral: Secondary | ICD-10-CM | POA: Diagnosis not present

## 2023-12-22 DIAGNOSIS — R490 Dysphonia: Secondary | ICD-10-CM | POA: Diagnosis not present

## 2023-12-22 DIAGNOSIS — K120 Recurrent oral aphthae: Secondary | ICD-10-CM | POA: Diagnosis not present

## 2023-12-22 DIAGNOSIS — J3800 Paralysis of vocal cords and larynx, unspecified: Secondary | ICD-10-CM

## 2023-12-22 DIAGNOSIS — J019 Acute sinusitis, unspecified: Secondary | ICD-10-CM

## 2023-12-25 ENCOUNTER — Encounter: Payer: Self-pay | Admitting: Otolaryngology

## 2023-12-27 ENCOUNTER — Ambulatory Visit
Admission: RE | Admit: 2023-12-27 | Discharge: 2023-12-27 | Disposition: A | Discharge status: Home / Self Care | Source: Ambulatory Visit | Attending: Otolaryngology | Admitting: Otolaryngology

## 2023-12-27 DIAGNOSIS — J342 Deviated nasal septum: Secondary | ICD-10-CM | POA: Diagnosis not present

## 2023-12-27 DIAGNOSIS — J019 Acute sinusitis, unspecified: Secondary | ICD-10-CM

## 2023-12-27 DIAGNOSIS — E041 Nontoxic single thyroid nodule: Secondary | ICD-10-CM | POA: Diagnosis not present

## 2023-12-27 DIAGNOSIS — Z9682 Presence of neurostimulator: Secondary | ICD-10-CM | POA: Diagnosis not present

## 2023-12-27 DIAGNOSIS — J310 Chronic rhinitis: Secondary | ICD-10-CM | POA: Diagnosis not present

## 2023-12-27 DIAGNOSIS — J3800 Paralysis of vocal cords and larynx, unspecified: Secondary | ICD-10-CM

## 2023-12-27 MED ORDER — IOPAMIDOL (ISOVUE-300) INJECTION 61%
75.0000 mL | Freq: Once | INTRAVENOUS | Status: AC | PRN
  Administered 2023-12-27: 75 mL via INTRAVENOUS

## 2024-01-09 ENCOUNTER — Encounter: Payer: Self-pay | Admitting: Family Medicine

## 2024-01-09 NOTE — Telephone Encounter (Signed)
 Please review and advise.   JM

## 2024-01-12 ENCOUNTER — Ambulatory Visit

## 2024-01-12 VITALS — BP 117/70 | Ht 62.0 in | Wt 170.5 lb

## 2024-01-12 DIAGNOSIS — Z309 Encounter for contraceptive management, unspecified: Secondary | ICD-10-CM | POA: Diagnosis not present

## 2024-01-12 DIAGNOSIS — Z30013 Encounter for initial prescription of injectable contraceptive: Secondary | ICD-10-CM

## 2024-01-12 DIAGNOSIS — Z3009 Encounter for other general counseling and advice on contraception: Secondary | ICD-10-CM

## 2024-01-12 DIAGNOSIS — Z3042 Encounter for surveillance of injectable contraceptive: Secondary | ICD-10-CM

## 2024-01-12 NOTE — Progress Notes (Signed)
 11 Weeks   0 Days since last Depo    Voices no concerns today.  Counseled to adhere to 11 to 13 week intervals between depo injections for optimal benefit.  Depo given today per order by Fain Home, FNP  dated 02/13/2023.  Tolerated well R delt.  Last annual PE 02/14/2024.  Plans to have annual PE when Next depo is due 03/29/2024,  has reminder card.  Mita Vallo, RN

## 2024-01-16 ENCOUNTER — Other Ambulatory Visit: Payer: Self-pay | Admitting: Family Medicine

## 2024-01-16 DIAGNOSIS — Z6832 Body mass index (BMI) 32.0-32.9, adult: Secondary | ICD-10-CM

## 2024-01-17 DIAGNOSIS — J3801 Paralysis of vocal cords and larynx, unilateral: Secondary | ICD-10-CM | POA: Diagnosis not present

## 2024-01-17 DIAGNOSIS — E041 Nontoxic single thyroid nodule: Secondary | ICD-10-CM | POA: Diagnosis not present

## 2024-01-17 DIAGNOSIS — J329 Chronic sinusitis, unspecified: Secondary | ICD-10-CM | POA: Diagnosis not present

## 2024-01-17 NOTE — Telephone Encounter (Signed)
 Requested medication (s) are due for refill today: YES  Requested medication (s) are on the active medication list: YES  Last refill:  11/13/23 2 ML 2 RF  Future visit scheduled: yes  Notes to clinic:  missing Hgb A1c results    Requested Prescriptions  Pending Prescriptions Disp Refills   WEGOVY  0.25 MG/0.5ML SOAJ [Pharmacy Med Name: WEGOVY  0.25 MG/0.5ML SUBQ SOLN ML] 2 mL 2    Sig: INJECT 0.25MG  SUBCUTANEOUSLY ONCE A WEEK     Endocrinology:  Diabetes - GLP-1 Receptor Agonists - semaglutide  Failed - 01/17/2024  1:55 PM      Failed - HBA1C in normal range and within 180 days    No results found for: "HGBA1C", "LABA1C"       Failed - Valid encounter within last 6 months    Recent Outpatient Visits           1 month ago Nasopharyngitis acute   [place] [facility], Dessie Flow, MD   1 month ago Oral ulceration   [place] [facility], Dessie Flow, MD       Future Appointments             In 3 months Augustus Ledger, Dessie Flow, MD [facility] Medicine at [facility]            Passed - Cr in normal range and within 360 days    Creatinine, Ser  Date Value Ref Range Status  04/25/2023 0.67 0.44 - 1.00 mg/dL Final

## 2024-01-23 ENCOUNTER — Other Ambulatory Visit: Payer: Self-pay | Admitting: Family Medicine

## 2024-01-23 DIAGNOSIS — F418 Other specified anxiety disorders: Secondary | ICD-10-CM

## 2024-01-23 DIAGNOSIS — G4709 Other insomnia: Secondary | ICD-10-CM

## 2024-01-24 NOTE — Telephone Encounter (Signed)
 Requested Prescriptions  Pending Prescriptions Disp Refills   traZODone  (DESYREL ) 50 MG tablet [Pharmacy Med Name: TRAZODONE  HCL 50 MG TAB] 90 tablet 0    Sig: TAKE 1 TABLET BY MOUTH AT BEDTIME     Psychiatry: Antidepressants - Serotonin Modulator Failed - 01/24/2024 12:43 PM      Failed - Valid encounter within last 6 months    Recent Outpatient Visits           1 month ago Nasopharyngitis acute   [place] [facility], Dessie Flow, MD   2 months ago Oral ulceration   [place] [facility], Dessie Flow, MD       Future Appointments             In 2 weeks Ronald Cockayne, NP [place] [facility] [place]   In 2 months Augustus Ledger, Dessie Flow, MD [facility] & [facility] at [facility]            Passed - Completed PHQ-2 or PHQ-9 in the last 360 days       sertraline  (ZOLOFT ) 50 MG tablet [Pharmacy Med Name: SERTRALINE  HCL 50 MG TAB] 90 tablet 0    Sig: TAKE 1 TABLET BY MOUTH ONCE DAILY     Psychiatry:  Antidepressants - SSRI - sertraline  Failed - 01/24/2024 12:43 PM      Failed - Valid encounter within last 6 months    Recent Outpatient Visits           1 month ago Nasopharyngitis acute   [place] [facility], Dessie Flow, MD   2 months ago Oral ulceration    [facility] at [facility] Augustus Ledger, Dessie Flow, MD       Future Appointments             In 2 weeks Ronald Cockayne, NP  [facility] [place]   In 2 months Augustus Ledger, Dessie Flow, MD [facility] Primary Care & [facility] Medicine at [facility], [facility]            Passed - AST in normal range and within 360 days    AST  Date Value Ref Range Status  04/19/2023 19 0 - 40 IU/L Final         Passed - ALT in normal range and within 360 days    ALT  Date Value Ref Range Status  04/19/2023 20 0 - 32 IU/L  Final         Passed - Completed PHQ-2 or PHQ-9 in the last 360 days

## 2024-02-06 ENCOUNTER — Encounter: Payer: Self-pay | Admitting: Family Medicine

## 2024-02-07 ENCOUNTER — Encounter: Payer: Self-pay | Admitting: Cardiology

## 2024-02-07 ENCOUNTER — Ambulatory Visit: Attending: Cardiology | Admitting: Cardiology

## 2024-02-07 VITALS — BP 110/68 | HR 93 | Ht 67.0 in | Wt 162.0 lb

## 2024-02-07 DIAGNOSIS — R000 Tachycardia, unspecified: Secondary | ICD-10-CM | POA: Diagnosis not present

## 2024-02-07 MED ORDER — METOPROLOL SUCCINATE ER 25 MG PO TB24: 25.0000 mg | ORAL_TABLET | Freq: Every day | ORAL | 3 refills | Status: DC

## 2024-02-07 NOTE — Patient Instructions (Signed)
 Medication Instructions:  Your physician recommends that you continue on your current medications as directed. Please refer to the Current Medication list given to you today.   *If you need a refill on your cardiac medications before your next appointment, please call your pharmacy*  Lab Work: No labs ordered today  If you have labs (blood work) drawn today and your tests are completely normal, you will receive your results only by: MyChart Message (if you have MyChart) OR A paper copy in the mail If you have any lab test that is abnormal or we need to change your treatment, we will call you to review the results.  Testing/Procedures: No test ordered today   Follow-Up: At [facility], you and your health needs are our priority.  As part of our continuing mission to provide you with exceptional heart care, our providers are all part of one team.  This team includes your primary Cardiologist (physician) and Advanced Practice Providers or APPs (Physician Assistants and Nurse Practitioners) who all work together to provide you with the care you need, when you need it.  Your next appointment:   6 month(s)  Provider:   Redell Cave, MD or Tylene Lunch, NP

## 2024-02-07 NOTE — Progress Notes (Signed)
 Cardiology Office Note   Date:  02/07/2024  ID:  Jill, Woodard 2000-11-26, MRN [id number] PCP: Alvia Selinda PARAS, MD  [facility] Providers Cardiologist:  Redell Cave, MD     History of Present Illness Jill Woodard is a 23 y.o. female with a past medical history of seizures, anxiety, depression, thyroid megaly, inappropriate sinus tachycardia, who is here today for follow-up.  Previous echocardiogram completed revealed an LVEF of 50%.  ZIO XT monitor noted an average heart rate of 103.  She was started on Toprol -XL 12.5 mg daily.  She was last seen in clinic 08/14/2023 by Dr Cave.  At that time she was tolerating the medication well.  She was having episodes where she felt dizzy and passed out heart rate was 140.  She was unsure whether it was seizure activity or for the elevated heart rate.  Vital signs were stable.  Increase Toprol -XL to 25 mg daily goal was to keep average heart rate less than 100 bpm.  In the past was okay to increase Toprol -XL as needed to achieve heart rate goal as long as blood pressure allows.  She returns to clinic today accompanied by family member.  States that she has felt well from a cardiac perspective.  She has had no further instances of passing out and denies any palpitations or elevated heart rates.  States that she has been compliant with her Toprol -XL with any undue side effects.  Also is coming up on her 1 year mark from being seizure-free.  Denies any recent hospitalizations or visits to the emergency department.  ROS: 10 point review of systems has been reviewed and considered negative with exception ones listed in the HPI  Studies Reviewed EKG Interpretation Date/Time:  Wednesday February 07 2024 08:23:35 EDT Ventricular Rate:  73 PR Interval:  116 QRS Duration:  84 QT Interval:  386 QTC Calculation: 425 R Axis:   75  Text Interpretation: Normal sinus rhythm with sinus arrhythmia Normal ECG When compared with  ECG of 27-Jun-2023 08:50, No significant change was found Confirmed by Jill Woodard ([id number]) on 02/07/2024 8:25:28 AM    Event Monitor (Zio) 07/20/2023 Patient had a min HR of 56 bpm, max HR of 176 bpm, and avg HR of 103 bpm. Predominant underlying rhythm was Sinus Rhythm. Isolated SVEs were rare (<1.0%), and no SVE Couplets or SVE Triplets were present. Isolated VEs were rare (<1.0%), and no VE  Couplets or VE Triplets were present.    Conclusion Minimal heart rate 56, max heart rate 176, average heart rate 103. No A-fib or atrial flutter. No arrhythmias noted.  2D echo 05/10/2021  1. Left ventricular ejection fraction, by estimation, is 45 to 50%. Left  ventricular ejection fraction by 2D MOD biplane is 46.1 %. The left  ventricle has mildly decreased function. The left ventricle has no  regional wall motion abnormalities. Left  ventricular diastolic parameters are indeterminate.   2. Right ventricular systolic function is normal. The right ventricular  size is normal. There is normal pulmonary artery systolic pressure. The  estimated right ventricular systolic pressure is 18.5 mmHg.   3. The mitral valve is normal in structure. Mild mitral valve  regurgitation. No evidence of mitral stenosis.   4. The aortic valve has an indeterminant number of cusps. Aortic valve  regurgitation is not visualized. No aortic stenosis is present.   5. The inferior vena cava is normal in size with greater than 50%  respiratory variability, suggesting right atrial pressure  of 3 mmHg.  Risk Assessment/Calculations           Physical Exam VS:  BP 110/68 (BP Location: Left Arm, Patient Position: Sitting, Cuff Size: Normal)   Pulse 93   Ht 5' 7 (1.702 m)   Wt 162 lb (73.5 kg)   SpO2 99%   BMI 25.37 kg/m        Wt Readings from Last 3 Encounters:  02/07/24 162 lb (73.5 kg)  01/12/24 170 lb 8 oz (77.3 kg)  12/13/23 173 lb (78.5 kg)    GEN: Well nourished, well developed in no acute  distress NECK: No JVD; No carotid bruits CARDIAC: RRR, no murmurs, rubs, gallops RESPIRATORY:  Clear to auscultation without rales, wheezing or rhonchi  ABDOMEN: Soft, non-tender, non-distended EXTREMITIES:  No edema; No deformity   ASSESSMENT AND PLAN Inappropriate sinus tachycardia with EKG today revealing sinus rhythm with sinus arrhythmia with a rate of 73 with no acute change.  Prior ZIO monitoring revealed no arrhythmias noted.  Echocardiogram in 9/24 revealed an LVEF of 45 to 50%.  She has continued on Toprol -XL 25 mg today with a goal to keep heart rate less than 100 bpm.  She has not had any breakthrough episodes or had to increase her Toprol -XL dosing.       Dispo: Patient returns clinic to see MD/APP in 6 months or sooner if needed for further evaluation  Signed, Amelia Burgard, NP

## 2024-02-15 ENCOUNTER — Encounter: Payer: Self-pay | Admitting: Physician Assistant

## 2024-02-19 ENCOUNTER — Encounter: Payer: Self-pay | Admitting: Family Medicine

## 2024-02-23 DIAGNOSIS — J309 Allergic rhinitis, unspecified: Secondary | ICD-10-CM | POA: Diagnosis not present

## 2024-02-23 DIAGNOSIS — J329 Chronic sinusitis, unspecified: Secondary | ICD-10-CM | POA: Diagnosis not present

## 2024-02-23 DIAGNOSIS — J3489 Other specified disorders of nose and nasal sinuses: Secondary | ICD-10-CM | POA: Diagnosis not present

## 2024-02-23 DIAGNOSIS — J3801 Paralysis of vocal cords and larynx, unilateral: Secondary | ICD-10-CM | POA: Diagnosis not present

## 2024-02-28 ENCOUNTER — Ambulatory Visit: Admitting: Family Medicine

## 2024-02-28 ENCOUNTER — Encounter: Payer: Self-pay | Admitting: Family Medicine

## 2024-02-28 VITALS — BP 120/70 | HR 70 | Ht 67.0 in | Wt 159.2 lb

## 2024-02-28 DIAGNOSIS — E6609 Other obesity due to excess calories: Secondary | ICD-10-CM | POA: Insufficient documentation

## 2024-02-28 DIAGNOSIS — K5900 Constipation, unspecified: Secondary | ICD-10-CM | POA: Diagnosis not present

## 2024-02-28 DIAGNOSIS — K2100 Gastro-esophageal reflux disease with esophagitis, without bleeding: Secondary | ICD-10-CM | POA: Diagnosis not present

## 2024-02-28 MED ORDER — WEGOVY 0.25 MG/0.5ML ~~LOC~~ SOAJ: 0.2500 mg | SUBCUTANEOUS | 2 refills | Status: DC

## 2024-02-28 NOTE — Assessment & Plan Note (Signed)
 Weight loss and associated symptoms - Weight decreased from 181 pounds in April 2023 to 159 pounds currently - Lightheadedness present - Elevated heart rate noted - Dry mouth experienced; maintains adequate hydration  Physical Exam VITALS: P- 70 MEASUREMENTS: Weight- 159. CARDIOVASCULAR: Positive S1-S2, regular rate and rhythm, no additional heart sounds heart rate regular, 70 bpm.  Weight loss with Wegovy  Weight loss from 181 lbs to 159 lbs due to medication-induced appetite suppression. - Continue medication at 0.25 dose. - Encourage small, frequent meals to maintain nutrition and prevent dizziness and tachycardia. - Monitor weight and symptoms, especially dizziness. - Reassess medication dosage after bowel issues are controlled.

## 2024-02-28 NOTE — Assessment & Plan Note (Signed)
 Constipation - Bowel movements occur every three to four days, less frequent than desired - Miralax use resulted in stomach upset - Considering alternative fiber supplements  Constipation Constipation with bowel movements every 3-4 days, most likely exacerbated by medication. - Increase intake of natural fiber sources such as fruits and vegetables. - Consider fiber supplements like Citrucel or Metamucil if needed. - Can consider Miralax gummies if necessary. - Monitor bowel movement frequency and adjust fiber intake accordingly. - Check MyChart message with patient constipation management steps.

## 2024-02-28 NOTE — Progress Notes (Signed)
     Primary Care / Sports Medicine Office Visit  Patient Information:  Patient ID: Jill Woodard, female DOB: 2001-01-29 Age: 23 y.o. MRN: [id number]   Jill Woodard is a pleasant 23 y.o. female presenting with the following:  Chief Complaint  Patient presents with   Weight Check    Patient presents for a weigh check. She has been doing well on the Wegovy  0.25 mg dose. She has noticed some dizziness when going from sit to stand but overall doing well on dose.     Vitals:   02/28/24 0824 02/28/24 0853  BP: 120/70   Pulse: (!) 105 70  SpO2: 96%    Vitals:   02/28/24 0824  Weight: 159 lb 3.2 oz (72.2 kg)  Height: 5' 7 (1.702 m)   Body mass index is 24.93 kg/m.  No results found.   Independent interpretation of notes and tests performed by another provider:   None  Procedures performed:   None  Pertinent History, Exam, Impression, and Recommendations:   Problem List Items Addressed This Visit     Constipation   Constipation - Bowel movements occur every three to four days, less frequent than desired - Miralax use resulted in stomach upset - Considering alternative fiber supplements  Constipation Constipation with bowel movements every 3-4 days, most likely exacerbated by medication. - Increase intake of natural fiber sources such as fruits and vegetables. - Consider fiber supplements like Citrucel or Metamucil if needed. - Can consider Miralax gummies if necessary. - Monitor bowel movement frequency and adjust fiber intake accordingly. - Check MyChart message with patient constipation management steps.      Gastroesophageal reflux disease   - Keep upcoming visit with gastroenterology      Obesity due to excess calories with serious comorbidity - Primary   Weight loss and associated symptoms - Weight decreased from 181 pounds in April 2023 to 159 pounds currently - Lightheadedness present - Elevated heart rate noted - Dry mouth experienced;  maintains adequate hydration  Physical Exam VITALS: P- 70 MEASUREMENTS: Weight- 159. CARDIOVASCULAR: Positive S1-S2, regular rate and rhythm, no additional heart sounds heart rate regular, 70 bpm.  Weight loss with Wegovy  Weight loss from 181 lbs to 159 lbs due to medication-induced appetite suppression. - Continue medication at 0.25 dose. - Encourage small, frequent meals to maintain nutrition and prevent dizziness and tachycardia. - Monitor weight and symptoms, especially dizziness. - Reassess medication dosage after bowel issues are controlled.      Relevant Medications   Semaglutide -Weight Management (WEGOVY ) 0.25 MG/0.5ML SOAJ     Orders & Medications Medications:  Meds ordered this encounter  Medications   Semaglutide -Weight Management (WEGOVY ) 0.25 MG/0.5ML SOAJ    Sig: Inject 0.25 mg into the skin once a week.    Dispense:  2 mL    Refill:  2   No orders of the defined types were placed in this encounter.    No follow-ups on file.     Selinda JINNY Ku, MD, [facility]   Primary Care Sports Medicine Primary Care and Sports Medicine at [facility]

## 2024-02-28 NOTE — Assessment & Plan Note (Signed)
-   Keep upcoming visit with gastroenterology

## 2024-02-28 NOTE — Patient Instructions (Signed)
 Patient Plan:  1. Constipation:    - Increase intake of natural fiber from fruits and vegetables.    - Consider using fiber supplements like Citrucel or Metamucil.    - Try Miralax gummies if needed.    - Monitor bowel movement frequency and adjust fiber intake as necessary.    - Review constipation management steps in MyChart.  2. Gastroesophageal Reflux Disease:    - Keep the upcoming appointment with gastroenterology.  3. Weight Loss and Associated Symptoms:    - Continue taking Wegovy  at the 0.25 mg dose.    - Eat small, frequent meals to maintain nutrition and prevent dizziness and elevated heart rate.    - Monitor weight and symptoms, particularly dizziness.    - Reassess medication dosage after addressing bowel issues.  4. Red Flags to Watch For:    - If you experience severe dizziness, increased heart rate, or any new symptoms, seek medical attention.  5. Follow-Up:    - Attend scheduled appointments and monitor symptoms as instructed.

## 2024-03-28 ENCOUNTER — Encounter: Payer: Self-pay | Admitting: Family Medicine

## 2024-03-28 ENCOUNTER — Ambulatory Visit: Admitting: Family Medicine

## 2024-03-28 VITALS — BP 113/71 | HR 84 | Ht 67.0 in | Wt 160.6 lb

## 2024-03-28 DIAGNOSIS — Z309 Encounter for contraceptive management, unspecified: Secondary | ICD-10-CM | POA: Diagnosis not present

## 2024-03-28 DIAGNOSIS — Z30013 Encounter for initial prescription of injectable contraceptive: Secondary | ICD-10-CM | POA: Diagnosis not present

## 2024-03-28 DIAGNOSIS — Z3042 Encounter for surveillance of injectable contraceptive: Secondary | ICD-10-CM | POA: Insufficient documentation

## 2024-03-28 MED ORDER — MEDROXYPROGESTERONE ACETATE 150 MG/ML IM SUSP
150.0000 mg | INTRAMUSCULAR | Status: AC
  Administered 2024-03-28: 150 mg via INTRAMUSCULAR

## 2024-03-28 NOTE — Progress Notes (Signed)
 Pt is here for family planning visit.  Family planning education card reviewed and given to pt. Depo given in L deltoid and tolerated well, reminder card given. Larraine JONELLE Northern, RN

## 2024-03-28 NOTE — Assessment & Plan Note (Addendum)
 Depo provera  administered today, tolerated well. She reports she has been on Depo Provera  continuously since the age of 23 yo (about 9 years now). Risks and benefits of continuous use discussed, most importantly the bone density risks. Given her seizures are worse around time of menses, asked her to talk with her neurologist about how to handle a possible switch. Discussed LARCs like Nexplanon and Liletta IUD.

## 2024-03-28 NOTE — Progress Notes (Signed)
 [facility] [address], [address] [place] [address] Main phone: [phone]  Family Planning Visit - Repeat Yearly Visit  Subjective:  Jill Woodard is a 23 y.o. G0P0000  being seen today for an annual wellness visit and to discuss contraception options. The patient is currently using hormonal injection for pregnancy prevention. Patient does not want a pregnancy in the next year.   Patient reports they are looking for a method with the following characteristics:  High efficacy at preventing pregnancy Discrete method Long term method  Patient has the following medical problems:  Patient Active Problem List   Diagnosis Date Noted   Encounter for management and injection of depo-Provera  03/28/2024   Obesity due to excess calories with serious comorbidity 02/28/2024   Constipation 02/28/2024   Nasopharyngitis acute 12/13/2023   Oral ulceration 11/22/2023   Gastroesophageal reflux disease 11/22/2023   Other insomnia 07/20/2023   Skin lesion 07/20/2023   Palpitation 05/02/2023   Asthma 04/19/2023   Tachycardia 04/19/2023   Healthcare maintenance 04/19/2023   Thyroid nodule 04/19/2023   Cervical radicular pain 02/14/2023   Depression with anxiety 01/03/2023   Right wrist tendinitis 01/03/2023   Focal seizures (HCC) 10/19/2022   Hx of migraines 12/07/2021   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 Reports she has been on Depo Provera  continuously since 23 years old (after pills were tried and not effective). She likes having no periods because her epilepsy is worse with and around time of menses.   Review of Systems  Constitutional:  Negative for fever, malaise/fatigue and weight  loss.  Respiratory:  Negative for shortness of breath.   Cardiovascular:  Negative for chest pain and palpitations.  (+) HA w/ photophobia a couple days ago (+) dizziness with abrupt changes in position (sitting to standing) (+) bruising way too easily  See flowsheet for further details and programmatic requirements Hyperlink available at the top of the signed note in blue.  Flow sheet content below:  Pregnancy Intention Screening Does the patient want to become pregnant in the next year?: No Does the patient's partner want to become pregnant in the next year?: No Would the patient like to discuss contraceptive options today?: No Other:  Is it okay to contact you by mail?: Yes Difficulty accessing hygiene products (feminine products/soap) in the last 3 months: No Contraception History Past methods of contraception used by patient:: Hormonal Injection, Contraceptive Pill Adverse effects associated with Contraceptive Pill: seizures worse Adverse effects associated with Hormonal Injection: none Sexual History What age did you start your period?: 12 How often do you have your period?: none with depo Date of last sex?: 02/27/24 Has the patient had unprotected sex within the last 5 days?: No Do you have sex with men, women, both men and women?: Men only In the past 2 months how many partners have you had sex with?: 1 In the past 12 months, how many partners have you had sex with?: 2 Is it possible that any of your sex partners in the past 12 months had sex with someone else whild they were still in a sexual relationship with you?: No What ways do you have sex?: Vaginal Do you or your partner use condoms and/or dental dams every time you have vaginal,  oral or anal sex?: Yes Do you douche?: No Date of last HIV test?: 04/19/23 Have you ever had an STD?: Yes Have any of your partners had an STD?: Yes Partner Previous STD?: Chlamydia Have you or your partner ever shot up drugs?: No Have  any of your partners used drugs in the past?: No Have you or your partners exchanged money or drugs for sex?: No Counseling Education: Make informed decision about family planning, Triple P information provided, Provided preconception counseling, Reduce risk of transmission and protection from STD's and HIV, Understand BMI >25 or >18.5 is a health risk (weight management educational materials to be provided to client requests), Promoted daily consumption of MVI with folic acid if capable of conceiving., Review immunization history, inform client of recommended vaccines per CDC's ACIP Guidelines and refer to Immunization clinic, Results of physical assessment and labs (if performed), How to discontinue the method selected and information on back up method used, How to use the method selected and information on back up method used, How to use the method consistently and correctly, Warning signs for rare but serious adverse events and what to do if they experience a warning sign (including emergency 24 hour number, where to seek emergency service outside of hours of operation), Teach back method completed, When to return for follow up (planned return schedule) Contraception Wrap Up Current Method: Hormonal Injection End Method: Hormonal Injection Contraception Counseling Provided: Yes How was the end contraceptive method provided?: Provided on site  Diabetes screening This patient is 23 y.o. with a BMI of Body mass index is 25.15 kg/m.[name]  Is patient eligible for diabetes screening (age >35 and BMI >25)?  no  Was Hgb A1c ordered? not applicable  STI screening Patient reports 2 of partners in last year.  Does this patient desire STI screening?  No - declines  Hepatitis C screening Has patient been screened once for HCV in the past?  No  No results found for: HCVAB  Cervical Cancer Screening  Result Date Procedure Results Follow-ups  05/23/2023 IGP, rfx Aptima HPV ASCU DIAGNOSIS:: Comment  (A) Specimen adequacy:: Comment Clinician Provided ICD10: Comment Performed by:: Comment Electronically signed by:: Comment PAP Smear Comment: . PATHOLOGIST PROVIDED ICD10:: Comment Note:: Comment Test Methodology: Comment PAP Reflex: Comment    Health Maintenance Due  Topic Date Due   Meningococcal B Vaccine (1 of 2 - Standard) 07/16/2018   Pneumococcal Vaccine: 19-49 Years (1 of 2 - PCV) 02/14/2021   INFLUENZA VACCINE  03/15/2024   Cervical Cancer Screening (Pap smear)  05/22/2024   The following portions of the patient's history were reviewed and updated as appropriate: allergies, current medications, past family history, past medical history, past social history, past surgical history and problem list. Problem list updated.  Objective:   Vitals:   03/28/24 0826  BP: 113/71  Pulse: 84  Weight: 160 lb 9.6 oz (72.8 kg)  Height: 5' 7 (1.702 m)   Physical Exam Vitals and nursing note reviewed.  Constitutional:      Appearance: Normal appearance.  HENT:     Head: Normocephalic.     Mouth/Throat:     Mouth: Mucous membranes are moist.  Eyes:     General: No scleral icterus.       Right eye: No discharge.        Left eye: No discharge.     Conjunctiva/sclera: Conjunctivae normal.  Cardiovascular:     Rate and Rhythm: Normal rate and regular rhythm.     Heart  sounds: Normal heart sounds.  Pulmonary:     Effort: Pulmonary effort is normal.     Breath sounds: Normal breath sounds.  Abdominal:     General: Bowel sounds are normal. There is no distension.     Palpations: Abdomen is soft. There is no mass.     Tenderness: There is no abdominal tenderness. There is no rebound.     Hernia: No hernia is present.  Genitourinary:    Comments: Declined genital exam, no swabs collected Musculoskeletal:        General: Normal range of motion.     Cervical back: Neck supple. No rigidity or tenderness.  Lymphadenopathy:     Head:     Right side of head: No submandibular,  preauricular or posterior auricular adenopathy.     Left side of head: No submandibular, preauricular or posterior auricular adenopathy.     Cervical: No cervical adenopathy.     Right cervical: No superficial or posterior cervical adenopathy.    Left cervical: No superficial or posterior cervical adenopathy.     Upper Body:     Right upper body: No supraclavicular adenopathy.     Left upper body: No supraclavicular adenopathy.  Skin:    General: Skin is warm and dry.     Coloration: Skin is not jaundiced or pale.     Findings: No bruising, erythema, lesion or rash.  Neurological:     Mental Status: She is alert and oriented to person, place, and time.  Psychiatric:        Mood and Affect: Mood normal.        Behavior: Behavior normal.    Assessment and Plan:  Jill Woodard is a 23 y.o. female G0P0000 presenting to the [facility] for an yearly wellness and contraception visit  Contraception counseling:  Reviewed options based on patient desire and reproductive life plan. Patient is interested in Hormonal Injection. This was provided to the patient today. Risks and benefits of continuous use discussed, most importantly the bone density risks. Given her seizures are worse around time of menses, asked her to talk with her neurologist about how to handle a possible switch. Discussed LARCs like Nexplanon and Liletta IUD.   Risks, benefits, and typical effectiveness rates were reviewed.  Questions were answered.  Written information was also given to the patient to review.    The patient will follow up in  3 years for surveillance.  The patient was told to call with any further questions, or with any concerns about this method of contraception.  Emphasized use of condoms 100% of the time for STI prevention.  Emergency Contraception Precautions (ECP): Patient assessed for need of ECP. She is not a candidate based on report of unprotected sex more than 120 hours  ago (5 days).   Encounter for management and injection of depo-Provera  Assessment & Plan: Depo provera  administered today, tolerated well. She reports she has been on Depo Provera  continuously since the age of 23 yo (about 9 years now). Risks and benefits of continuous use discussed, most importantly the bone density risks. Given her seizures are worse around time of menses, asked her to talk with her neurologist about how to handle a possible switch. Discussed LARCs like Nexplanon and Liletta IUD.   Orders: -     medroxyPROGESTERone  Acetate   No follow-ups on file.  Future Appointments  Date Time Provider Department Center  04/09/2024  8:40 AM Beather Delon Gibson, [place] [facility]  05/28/2024  9:00 AM Alvia Selinda PARAS, MD [facility]   Betsey CHRISTELLA Helling, MD

## 2024-03-29 NOTE — Telephone Encounter (Signed)
 Please review.  KP

## 2024-04-02 ENCOUNTER — Telehealth: Payer: Self-pay | Admitting: Family Medicine

## 2024-04-02 NOTE — Telephone Encounter (Signed)
 Pt. Called wanting to speak with Dr. Macario about her possibly switching to a different method of birth control.

## 2024-04-05 NOTE — Addendum Note (Signed)
 Addended by: Nicole Defino M on: 04/05/2024 11:34 AM   Modules accepted: Level of Service

## 2024-04-08 ENCOUNTER — Other Ambulatory Visit: Payer: Self-pay

## 2024-04-08 DIAGNOSIS — Z3009 Encounter for other general counseling and advice on contraception: Secondary | ICD-10-CM

## 2024-04-09 ENCOUNTER — Other Ambulatory Visit (INDEPENDENT_AMBULATORY_CARE_PROVIDER_SITE_OTHER)

## 2024-04-09 ENCOUNTER — Telehealth: Payer: Self-pay

## 2024-04-09 ENCOUNTER — Ambulatory Visit: Payer: Self-pay | Admitting: Physician Assistant

## 2024-04-09 ENCOUNTER — Ambulatory Visit: Admitting: Physician Assistant

## 2024-04-09 ENCOUNTER — Encounter: Payer: Self-pay | Admitting: Physician Assistant

## 2024-04-09 VITALS — BP 112/70 | HR 84 | Ht 67.0 in | Wt 155.0 lb

## 2024-04-09 DIAGNOSIS — R194 Change in bowel habit: Secondary | ICD-10-CM | POA: Diagnosis not present

## 2024-04-09 DIAGNOSIS — Z9682 Presence of neurostimulator: Secondary | ICD-10-CM | POA: Diagnosis not present

## 2024-04-09 DIAGNOSIS — K219 Gastro-esophageal reflux disease without esophagitis: Secondary | ICD-10-CM

## 2024-04-09 DIAGNOSIS — K121 Other forms of stomatitis: Secondary | ICD-10-CM | POA: Diagnosis not present

## 2024-04-09 DIAGNOSIS — R000 Tachycardia, unspecified: Secondary | ICD-10-CM | POA: Diagnosis not present

## 2024-04-09 DIAGNOSIS — Z87898 Personal history of other specified conditions: Secondary | ICD-10-CM | POA: Diagnosis not present

## 2024-04-09 LAB — COMPREHENSIVE METABOLIC PANEL WITH GFR
ALT: 9 U/L (ref 0–35)
AST: 12 U/L (ref 0–37)
Albumin: 4.5 g/dL (ref 3.5–5.2)
Alkaline Phosphatase: 57 U/L (ref 39–117)
BUN: 9 mg/dL (ref 6–23)
CO2: 25 meq/L (ref 19–32)
Calcium: 9.5 mg/dL (ref 8.4–10.5)
Chloride: 105 meq/L (ref 96–112)
Creatinine, Ser: 0.73 mg/dL (ref 0.40–1.20)
GFR: 116.96 mL/min (ref >60.00)
Glucose, Bld: 83 mg/dL (ref 70–99)
Potassium: 3.8 meq/L (ref 3.5–5.1)
Sodium: 141 meq/L (ref 135–145)
Total Bilirubin: 0.5 mg/dL (ref 0.2–1.2)
Total Protein: 7.9 g/dL (ref 6.0–8.3)

## 2024-04-09 LAB — CBC
HCT: 39.2 % (ref 36.0–46.0)
Hemoglobin: 12.9 g/dL (ref 12.0–15.0)
MCHC: 32.9 g/dL (ref 30.0–36.0)
MCV: 83.2 fl (ref 78.0–100.0)
Platelets: 340 K/uL (ref 150.0–400.0)
RBC: 4.71 Mil/uL (ref 3.87–5.11)
RDW: 14.8 % (ref 11.5–15.5)
WBC: 6.3 K/uL (ref 4.0–10.5)

## 2024-04-09 LAB — LIPASE: Lipase: 19 U/L (ref 11.0–59.0)

## 2024-04-09 MED ORDER — PANTOPRAZOLE SODIUM 40 MG PO TBEC: 40.0000 mg | DELAYED_RELEASE_TABLET | Freq: Two times a day (BID) | ORAL | 1 refills | Status: DC

## 2024-04-09 NOTE — Progress Notes (Signed)
 Your labs were normal today.  We will see what the EGD shows.  Delon Failing, PA-C

## 2024-04-09 NOTE — Telephone Encounter (Signed)
 Request for surgical clearance:     Endoscopy Procedure  What type of surgery is being performed?     Endoscopy   When is this surgery scheduled?     05/08/2024  What type of clearance is required ?  Cardiac Clearance   Are there any medications that need to be held prior to surgery and how long? No medication. Cardiac Clearance only.   Practice name and name of physician performing surgery?      [facility]  What is your office phone and fax number?      Phone- [phone]  Fax- [phone]  Anesthesia type (None, local, MAC, general) ?       MAC  Please route your response to Blondie Barks, CMA

## 2024-04-09 NOTE — Patient Instructions (Signed)
 Your provider has requested that you go to the basement level for lab work before leaving today. Press B on the elevator. The lab is located at the first door on the left as you exit the elevator.  Increase your Pantoprazole  to twice daily.   We have sent the following medications to your pharmacy for you to pick up at your convenience: Pantoprazole    You have been scheduled for an endoscopy. Please follow written instructions given to you at your visit today.  If you use inhalers (even only as needed), please bring them with you on the day of your procedure.  If you take any of the following medications, they will need to be adjusted prior to your procedure:   DO NOT TAKE 7 DAYS PRIOR TO TEST- Trulicity (dulaglutide) Ozempic, Wegovy  (semaglutide ) Mounjaro (tirzepatide) Bydureon Bcise (exanatide extended release)  DO NOT TAKE 1 DAY PRIOR TO YOUR TEST Rybelsus (semaglutide ) Adlyxin (lixisenatide) Victoza (liraglutide) Byetta (exanatide) ________________________________________________________  _______________________________________________________  If your blood pressure at your visit was 140/90 or greater, please contact your primary care physician to follow up on this.  _______________________________________________________  If you are age 75 or older, your body mass index should be between 23-30. Your Body mass index is 24.28 kg/m. If this is out of the aforementioned range listed, please consider follow up with your Primary Care Provider.  If you are age 11 or younger, your body mass index should be between 19-25. Your Body mass index is 24.28 kg/m. If this is out of the aformentioned range listed, please consider follow up with your Primary Care Provider.   ________________________________________________________  The [facility] providers would like to encourage you to use MYCHART to communicate with providers for non-urgent requests or questions.  Due to long hold  times on the telephone, sending your provider a message by [facility] may be a faster and more efficient way to get a response.  Please allow 48 business hours for a response.  Please remember that this is for non-urgent requests.  _______________________________________________________  [facility] is using a team-based approach to care.  Your team is made up of your doctor and two to three APPS. Our APPS (Nurse Practitioners and Physician Assistants) work with your physician to ensure care continuity for you. They are fully qualified to address your health concerns and develop a treatment plan. They communicate directly with your gastroenterologist to care for you. Seeing the Advanced Practice Practitioners on your physician's team can help you by facilitating care more promptly, often allowing for earlier appointments, access to diagnostic testing, procedures, and other specialty referrals.   Due to recent changes in healthcare laws, you may see the results of your imaging and laboratory studies on MyChart before your provider has had a chance to review them.  We understand that in some cases there may be results that are confusing or concerning to you. Not all laboratory results come back in the same time frame and the provider may be waiting for multiple results in order to interpret others.  Please give us  48 hours in order for your provider to thoroughly review all the results before contacting the office for clarification of your results.   Thank you for choosing me and [facility].  Delon Failing, PA-C

## 2024-04-09 NOTE — Progress Notes (Signed)
 Chief Complaint: Oral ulcerations, change in bowel habits and reflux  HPI:    Jill Woodard is a 23 year old female with a past medical history as listed below including epilepsy, depression and anxiety, on Wegovy , GERD, unilateral vocal cord paralysis, tachycardia (05/11/2023 echo with LVEF 45-50%) and multiple others, who was referred to me by Alvia Selinda PARAS, MD for a complaint of oral ulcerations, change in bowel habits and reflux.      09/14/2016 CTAP with contrast showed no acute findings.  Prominent stool in portions of the colon.    05/25/2023 patient saw neurology at [facility] and at that time doing well.    01/11/2024 CT of the neck with ongoing 2 cm left thyroid nodule.  Stable left lower neck vagal nerve stimulator device with no adverse features.    02/07/2024 office visit with cardiology for tachycardia.  She had previously been started on Toprol -XL 12.5 mg for an average heart rate of 103.  That time doing well.    Today, patient describes that she had a vagus nerve stimulator placed in 2016 and recently had an eval by ENT who told her she had partial paralysis of her vocal cords likely related to input of this.  Vagus nerve stimulator has worked well for her and she has not had a seizure in the past 13 months.  Does describe a workup by cardiology which shows some changes in rhythm.  She feels well though.    Most concerning to her today is that she has had reflux symptoms for at least 7 years and sometimes develops red spots in the back of her throat that then turned into white spots and ulcers.  Apparently previously had this evaluated when she lived in [place] , saw an ENT and primary doctor who always would prescribe Magic mouthwash with lidocaine  which would help with this.  She has been on Pantoprazole  40 mg daily for the past year which initially helped but now does not seem to do much.  Instead she has been altering her diet.  No longer eating dairy, spicy food, red sauce, peppers,  onions or tomatoes as all of these seem to irritate her stomach.  Associated symptoms include occasional nausea.    Also describes that occasionally she will have constipation and occasionally she will have diarrhea.  She was told to start Metamucil but tells me that she just started this a few weeks ago and has had trouble remembering it.  Not sure if it is helping yet or not.  No lower abdominal pain.    Describes a family history of chron's disease in her grandfather.  Also patient has been on Wegovy  since March.  She does not feel like this has exacerbated any of her symptoms.  She has been able to lose some weight with this.    Denies fever, chills, weight loss or blood in her stool.  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    Past Surgical History:  Procedure Laterality Date   ADENOIDECTOMY     FINGER FRACTURE SURGERY     TYMPANOSTOMY TUBE PLACEMENT  VAGUS NERVE STIMULATOR GENERATOR CHANGE  04/16/2023   Battery change only   VAGUS NERVE STIMULATOR INSERTION  07/16/2015    Current Outpatient Medications  Medication Sig Dispense Refill   albuterol  (VENTOLIN  HFA) 108 (90 Base) MCG/ACT inhaler Inhale 1-2 puffs into the lungs every 6 (six) hours as needed for wheezing or shortness of breath. 18 g 2   cloBAZam (ONFI PO) Take 15 mg by mouth in the morning.     cloBAZam (ONFI) 10 MG tablet Take 20 mg by mouth at bedtime.     cyclobenzaprine  (FLEXERIL ) 5 MG tablet TAKE 1 TABLET BY MOUTH 3 TIMES DAILY AS NEEDED FOR MUSCLE SPASMS 90 tablet 0   fluticasone  (FLONASE ) 50 MCG/ACT nasal spray Place 2 sprays into both  nostrils daily. 16 g 11   lamoTRIgine  (LAMICTAL ) 100 MG tablet Take by mouth 2 (two) times daily.     lidocaine  (XYLOCAINE ) 2 % solution Use as directed 15 mLs in the mouth or throat as needed for mouth pain. 100 mL 0   meloxicam  (MOBIC ) 15 MG tablet Take 15 mg by mouth at bedtime.     metoprolol  succinate (TOPROL  XL) 25 MG 24 hr tablet Take 1 tablet (25 mg total) by mouth daily. 90 tablet 3   Multiple Vitamins-Minerals (MULTIVITAL PO) Take by mouth daily.     NAYZILAM 5 MG/0.1ML SOLN 3 times/day as needed-between meals & bedtime.     pantoprazole  (PROTONIX ) 40 MG tablet TAKE 1 TABLET BY MOUTH ONCE DAILY AS NEEDED FOR REFLUX 90 tablet 1   Semaglutide -Weight Management (WEGOVY ) 0.25 MG/0.5ML SOAJ Inject 0.25 mg into the skin once a week. 2 mL 2   sertraline  (ZOLOFT ) 50 MG tablet TAKE 1 TABLET BY MOUTH ONCE DAILY 90 tablet 0   traZODone  (DESYREL ) 50 MG tablet TAKE 1 TABLET BY MOUTH AT BEDTIME 90 tablet 0   Current Facility-Administered Medications  Medication Dose Route Frequency Provider Last Rate Last Admin   medroxyPROGESTERone  (DEPO-PROVERA ) injection 150 mg  150 mg Intramuscular Q90 days    150 mg at 03/28/24 0947    Allergies as of 04/09/2024 - Review Complete 04/09/2024  Allergen Reaction Noted   Amoxicillin Rash and Hives 11/18/2014   Dust mite extract Other (See Comments) 12/17/2010   Other Hives 04/05/2023   Grapefruit concentrate Other (See Comments) 07/14/2015   Grapefruit extract Other (See Comments) 10/19/2022   Vancomycin Itching 06/27/2023   Voltaren [diclofenac sodium] Itching 02/13/2023   Wound dressings Hives, Itching, and Rash 04/07/2023    Family History  Problem Relation Age of Onset   Anxiety disorder Mother    Arthritis Mother    Cancer Mother    Obesity Mother    Varicose Veins Mother    Ovarian cysts Mother    Alcohol abuse Father    Drug abuse Father    Depression Sister    ADD / ADHD Sister    Anxiety disorder Sister    Vision loss Sister     Supraventricular tachycardia Sister    Arthritis Maternal Grandmother    Asthma Maternal Grandmother    Diabetes Maternal Grandmother    Hearing loss Maternal Grandmother    Obesity Maternal Grandmother    Cancer Maternal Grandfather    Varicose Veins Maternal Grandfather    Cancer Paternal Grandfather     Social History   Socioeconomic History   Marital status: Single    Spouse name: Not on file   Number of children: 0   Years of education: Not on file  Highest education level: Some college, no degree  Occupational History   Occupation: receptionist  Tobacco Use   Smoking status: Never    Passive exposure: Never   Smokeless tobacco: Not on file  Vaping Use   Vaping status: Never Used  Substance and Sexual Activity   Alcohol use: No   Drug use: No   Sexual activity: Yes    Partners: Male    Birth control/protection: Injection  Other Topics Concern   Not on file  Social History Narrative   Not on file   Social Drivers of Health   Financial Resource Strain: Low Risk  (02/28/2024)   Overall Financial Resource Strain (CARDIA)    Difficulty of Paying Living Expenses: Not very hard  Food Insecurity: No Food Insecurity (02/28/2024)   Hunger Vital Sign    Worried About Running Out of Food in the Last Year: Never true    Ran Out of Food in the Last Year: Never true  Transportation Needs: No Transportation Needs (02/28/2024)   PRAPARE - Administrator, Civil Service (Medical): No    Lack of Transportation (Non-Medical): No  Physical Activity: Insufficiently Active (02/28/2024)   Exercise Vital Sign    Days of Exercise per Week: 4 days    Minutes of Exercise per Session: 10 min  Stress: No Stress Concern Present (02/28/2024)   [employer] of Occupational Health - Occupational Stress Questionnaire    Feeling of Stress: Only a little  Social Connections: Moderately Isolated (02/28/2024)   Social Connection and Isolation Panel    Frequency of Communication  with Friends and Family: More than three times a week    Frequency of Social Gatherings with Friends and Family: Twice a week    Attends Religious Services: Never    Database administrator or Organizations: Yes    Attends Engineer, structural: More than 4 times per year    Marital Status: Never married  Intimate Partner Violence: Not At Risk (03/28/2024)   Humiliation, Afraid, Rape, and Kick questionnaire    Fear of Current or Ex-Partner: No    Emotionally Abused: No    Physically Abused: No    Sexually Abused: No    Review of Systems:    Constitutional: No fever or chills Skin: No rash Cardiovascular: No chest pain Respiratory: No SOB or cough Gastrointestinal: See HPI and otherwise negative Genitourinary: No dysuria Neurological: No headache, dizziness or syncope Musculoskeletal: No new muscle or joint pain Hematologic: No bleeding  Psychiatric: No history of depression or anxiety   Physical Exam:  Vital signs: BP 112/70   Pulse 84   Ht 5' 7 (1.702 m)   Wt 155 lb (70.3 kg)   BMI 24.28 kg/m    Constitutional:   Pleasant Caucasian female appears to be in NAD, Well developed, Well nourished, alert and cooperative Head:  Normocephalic and atraumatic. Eyes:   PEERL, EOMI. No icterus. Conjunctiva pink. Ears:  Normal auditory acuity. Neck:  Supple Throat: Oral cavity and pharynx without inflammation, swelling or lesion.  Respiratory: Respirations even and unlabored. Lungs clear to auscultation bilaterally.   No wheezes, crackles, or rhonchi.  Cardiovascular: Normal S1, S2. No MRG. Regular rate and rhythm. No peripheral edema, cyanosis or pallor.  Gastrointestinal:  Soft, nondistended, nontender. No rebound or guarding. Normal bowel sounds. No appreciable masses or hepatomegaly. Rectal:  Not performed.  Msk:  Symmetrical without gross deformities. Without edema, no deformity or joint abnormality.  Neurologic:  Alert and  oriented x4;  grossly normal neurologically.   Skin:   Dry and intact without significant lesions or rashes. Psychiatric: Demonstrates good judgement and reason without abnormal affect or behaviors.  RELEVANT LABS AND IMAGING: CBC    Component Value Date/Time   WBC 5.9 04/25/2023 1253   RBC 4.46 04/25/2023 1253   HGB 12.2 04/25/2023 1253   HGB 12.4 04/19/2023 0000   HCT 37.8 04/25/2023 1253   HCT 37.2 04/19/2023 0000   PLT 439 (H) 04/25/2023 1253   PLT 418 04/19/2023 0000   MCV 84.8 04/25/2023 1253   MCV 82 04/19/2023 0000   MCH 27.4 04/25/2023 1253   MCHC 32.3 04/25/2023 1253   RDW 13.2 04/25/2023 1253   RDW 13.7 04/19/2023 0000    CMP     Component Value Date/Time   NA 138 04/25/2023 1253   NA 140 04/19/2023 0000   K 3.3 (L) 04/25/2023 1253   CL 107 04/25/2023 1253   CO2 22 04/25/2023 1253   GLUCOSE 101 (H) 04/25/2023 1253   BUN 9 04/25/2023 1253   BUN 8 04/19/2023 0000   CREATININE 0.67 04/25/2023 1253   CALCIUM 9.3 04/25/2023 1253   PROT 7.6 04/19/2023 0000   ALBUMIN 4.6 04/19/2023 0000   AST 19 04/19/2023 0000   ALT 20 04/19/2023 0000   ALKPHOS 79 04/19/2023 0000   BILITOT 0.3 04/19/2023 0000   GFRNONAA >60 04/25/2023 1253   GFRAA NOT CALCULATED 02/27/2015 1101    Assessment: 1.  GERD: Continues with symptoms of nausea and stomach upset as well as reflux regardless of Pantoprazole  40 mg daily for the past year and watching her diet, grandfather with a history of Crohn's disease; consider gastritis +/- functional dyspepsia 2.  Change in bowel habits: Radiates from diarrhea to constipation at times; likely diet related +/- IBS 3.  Aphthous ulcers: Patient describes occasionally developing ulcers on the back of her throat, apparently has been evaluated by ENT and multiple others in the past who are exactly sure what was happening, has used Magic mouthwash with lidocaine  which has helped, no symptoms currently; instead relation to GERD 4.  Tachycardia: Controlled on Toprol -XL, follows with cardiology, doing  well, recent echo with LVEF 45-50% 5.  History of seizures: Status post vagal nerve stimulator a year ago, patient has been seizure-free for 13 months, continues to follow with neurology  Plan: 1.  Scheduled patient for a diagnostic EGD in the [facility] with Dr. Wilhelmenia.  Provide the patient a detailed list of risks of procedure and she agrees to proceed.   Patient is appropriate for endoscopic procedure(s) in the ambulatory ([facility]) setting.  Reach out to Norleen Schillings our anesthesiologist in regards to vagal nerve stimulator to make sure there is nothing we need to do for this additionally.  Also sent cardiac clearance given history of tachycardia.  Patient will also need to hold her Wegovy  for 7 days prior to time of procedure. Pending findings could consider further workup of gallbladder per patient's concern. 2.  Increased Pantoprazole  to 40 mg twice daily, 30-60 minutes for breakfast and dinner #60 with 5 refills 3.  Recommend the patient is a fiber supplement daily for variation in bowel habits. 4.  Labs today to include a CBC, CMP and lipase 5.  Patient to follow in clinic per recommendations after time of procedure.  Delon Failing, PA-C [facility] 04/09/2024, 8:47 AM  Cc: Alvia Selinda PARAS, MD

## 2024-04-09 NOTE — Telephone Encounter (Signed)
   Primary Cardiologist: Redell Cave, MD  Chart reviewed as part of pre-operative protocol coverage. Given past medical history and time since last visit, based on ACC/AHA guidelines, Jill Woodard would be at acceptable risk for the planned procedure without further cardiovascular testing.   Patient should contact our office if she is having new symptoms that are concerning from a cardiac perspective to arrange a follow-up appointment.    I will route this recommendation to the requesting party via Epic fax function and remove from pre-op pool.  Please call with questions.  Rosaline EMERSON Bane, NP-C  04/09/2024, 3:50 PM [address] [place], [place] [address] Office [phone] Fax [phone]

## 2024-04-16 NOTE — Progress Notes (Signed)
 Patient informed she is cleared for her upcoming procedure.

## 2024-04-16 NOTE — Progress Notes (Signed)
 Attending Physician's Attestation   I have reviewed the chart.   I agree with the Advanced Practitioner's note, impression, and recommendations with any updates as below. Endoscopic evaluation is very reasonable.  Ultimately I will defer to my anesthesia team (whom I have placed on here as well) to confirm safety for procedure in the ambulatory [facility] versus having to be done in the hospital-based setting.  For now we will keep the current scheduled date for procedure and finalize plan after review by our anesthesia team.   Aloha Finner, MD [facility] Advanced Endoscopy Office # [phone]

## 2024-04-17 ENCOUNTER — Encounter: Payer: Self-pay | Admitting: Family Medicine

## 2024-04-18 ENCOUNTER — Other Ambulatory Visit: Payer: Self-pay

## 2024-04-18 DIAGNOSIS — J00 Acute nasopharyngitis [common cold]: Secondary | ICD-10-CM

## 2024-04-19 ENCOUNTER — Encounter: Payer: Self-pay | Admitting: Family Medicine

## 2024-04-22 ENCOUNTER — Ambulatory Visit
Admission: EM | Admit: 2024-04-22 | Discharge: 2024-04-22 | Disposition: A | Discharge status: Home / Self Care | Attending: Emergency Medicine | Admitting: Emergency Medicine

## 2024-04-22 ENCOUNTER — Ambulatory Visit: Payer: Self-pay | Admitting: Emergency Medicine

## 2024-04-22 ENCOUNTER — Encounter: Admitting: Family Medicine

## 2024-04-22 ENCOUNTER — Ambulatory Visit (INDEPENDENT_AMBULATORY_CARE_PROVIDER_SITE_OTHER)

## 2024-04-22 DIAGNOSIS — R051 Acute cough: Secondary | ICD-10-CM

## 2024-04-22 DIAGNOSIS — R059 Cough, unspecified: Secondary | ICD-10-CM | POA: Diagnosis not present

## 2024-04-22 DIAGNOSIS — U071 COVID-19: Secondary | ICD-10-CM

## 2024-04-22 DIAGNOSIS — R509 Fever, unspecified: Secondary | ICD-10-CM | POA: Diagnosis not present

## 2024-04-22 DIAGNOSIS — J45901 Unspecified asthma with (acute) exacerbation: Secondary | ICD-10-CM | POA: Diagnosis not present

## 2024-04-22 DIAGNOSIS — R0989 Other specified symptoms and signs involving the circulatory and respiratory systems: Secondary | ICD-10-CM | POA: Diagnosis not present

## 2024-04-22 MED ORDER — PROMETHAZINE-DM 6.25-15 MG/5ML PO SYRP: 5.0000 mL | ORAL_SOLUTION | Freq: Four times a day (QID) | ORAL | 0 refills | Status: DC | PRN

## 2024-04-22 MED ORDER — FLUCONAZOLE 150 MG PO TABS
150.0000 mg | ORAL_TABLET | Freq: Once | ORAL | 1 refills | Status: AC
Start: 2024-04-22 — End: 2024-04-22

## 2024-04-22 MED ORDER — PREDNISONE 20 MG PO TABS: 40.0000 mg | ORAL_TABLET | Freq: Every day | ORAL | 0 refills | Status: AC

## 2024-04-22 MED ORDER — DOXYCYCLINE HYCLATE 100 MG PO CAPS
100.0000 mg | ORAL_CAPSULE | Freq: Two times a day (BID) | ORAL | 0 refills | Status: AC
Start: 2024-04-22 — End: 2024-04-27

## 2024-04-22 NOTE — ED Provider Notes (Signed)
 HPI  SUBJECTIVE:  Jill Woodard is a 23 y.o. female who presents with 6 days of chest congestion, fevers Tmax 102, nasal congestion, clear rhinorrhea, cough productive of green mucus, substernal chest pain described as soreness and tightness from coughing, shortness of breath, wheezing, body ache, sore throat, postnasal drip.  She is coughing all night long.  No facial swelling, upper dental pain.  Tested positive for COVID 3 days ago.  She has had 3 doses of the COVID vaccines.  She was on azithromycin  within the past 3 months for sinusitis.  She has taken Tylenol  within the past 6 hours.  She has also been using her albuterol  inhaler with a spacer with improvement in her chest tightness.  She is also using over-the-counter cold and flu medications, cough syrups, elderberry, tea/honey, warm baths and Flonase .  Symptoms are worse with activity.  She has a past medical history of seizures, asthma with no admissions, GERD, SVT, thyroid nodule, possible peptic ulcer disease-has a endoscopy scheduled in October.  She is currently taking meloxicam .  She has tolerated cephalosporins before.  LMP: Amenorrheic due to Depo.  PCP: [facility].  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    Past Surgical History:  Procedure Laterality Date   ADENOIDECTOMY     FINGER FRACTURE SURGERY     TYMPANOSTOMY TUBE PLACEMENT     VAGUS NERVE STIMULATOR GENERATOR CHANGE  04/16/2023   Battery change only   VAGUS NERVE STIMULATOR INSERTION  07/16/2015    Family History  Problem Relation Age of Onset   Anxiety disorder Mother     Arthritis Mother    Cancer Mother    Obesity Mother    Varicose Veins Mother    Ovarian cysts Mother    Alcohol abuse Father    Drug abuse Father    Depression Sister    ADD / ADHD Sister    Anxiety disorder Sister    Vision loss Sister    Supraventricular tachycardia Sister    Arthritis Maternal Grandmother    Asthma Maternal Grandmother    Diabetes Maternal Grandmother    Hearing loss Maternal Grandmother    Obesity Maternal Grandmother    Cancer Maternal Grandfather    Varicose Veins Maternal Grandfather    Cancer Paternal Grandfather     Social History   Tobacco Use   Smoking status: Never    Passive exposure: Never  Vaping Use   Vaping status: Never Used  Substance Use Topics   Alcohol use: No   Drug use: No     Current Facility-Administered Medications:    medroxyPROGESTERone  (DEPO-PROVERA ) injection 150 mg, 150 mg, Intramuscular, Q90 days, , 150 mg at 03/28/24 0947  Current Outpatient Medications:    albuterol  (VENTOLIN  HFA) 108 (90 Base) MCG/ACT inhaler, Inhale 1-2 puffs into the lungs every 6 (six) hours as needed for wheezing or shortness of breath., Disp: 18 g, Rfl: 2   cloBAZam (ONFI PO), Take 15 mg by mouth in the morning., Disp: , Rfl:    doxycycline  (VIBRAMYCIN ) 100 MG capsule, Take 1 capsule (100 mg total)  by mouth 2 (two) times daily for 5 days., Disp: 10 capsule, Rfl: 0   fluconazole  (DIFLUCAN ) 150 MG tablet, Take 1 tablet (150 mg total) by mouth once for 1 dose. 1 tab po x 1. May repeat in 72 hours if no improvement, Disp: 2 tablet, Rfl: 1   lamoTRIgine  (LAMICTAL ) 100 MG tablet, Take by mouth 2 (two) times daily., Disp: , Rfl:    metoprolol  succinate (TOPROL  XL) 25 MG 24 hr tablet, Take 1 tablet (25 mg total) by mouth daily., Disp: 90 tablet, Rfl: 3   pantoprazole  (PROTONIX ) 40 MG tablet, Take 1 tablet (40 mg total) by mouth 2 (two) times daily., Disp: 60 tablet, Rfl: 1   predniSONE  (DELTASONE ) 20 MG tablet, Take 2 tablets (40 mg total) by mouth daily  with breakfast for 5 days., Disp: 10 tablet, Rfl: 0   promethazine -dextromethorphan (PROMETHAZINE -DM) 6.25-15 MG/5ML syrup, Take 5 mLs by mouth 4 (four) times daily as needed for cough., Disp: 118 mL, Rfl: 0   Semaglutide -Weight Management (WEGOVY ) 0.25 MG/0.5ML SOAJ, Inject 0.25 mg into the skin once a week., Disp: 2 mL, Rfl: 2   sertraline  (ZOLOFT ) 50 MG tablet, TAKE 1 TABLET BY MOUTH ONCE DAILY, Disp: 90 tablet, Rfl: 0   cloBAZam (ONFI) 10 MG tablet, Take 20 mg by mouth at bedtime., Disp: , Rfl:    cyclobenzaprine  (FLEXERIL ) 5 MG tablet, TAKE 1 TABLET BY MOUTH 3 TIMES DAILY AS NEEDED FOR MUSCLE SPASMS, Disp: 90 tablet, Rfl: 0   fluticasone  (FLONASE ) 50 MCG/ACT nasal spray, Place 2 sprays into both nostrils daily., Disp: 16 g, Rfl: 11   lidocaine  (XYLOCAINE ) 2 % solution, Use as directed 15 mLs in the mouth or throat as needed for mouth pain., Disp: 100 mL, Rfl: 0   meloxicam  (MOBIC ) 15 MG tablet, Take 15 mg by mouth at bedtime., Disp: , Rfl:    Multiple Vitamins-Minerals (MULTIVITAL PO), Take by mouth daily., Disp: , Rfl:    NAYZILAM 5 MG/0.1ML SOLN, 3 times/day as needed-between meals & bedtime., Disp: , Rfl:    traZODone  (DESYREL ) 50 MG tablet, TAKE 1 TABLET BY MOUTH AT BEDTIME, Disp: 90 tablet, Rfl: 0  Allergies  Allergen Reactions   Amoxicillin Rash and Hives   Dust Mite Extract Other (See Comments)   Other Hives    EEG Glue  Blisters, Red Rash  EEG Glue Blisters, Red Rash   Grapefruit Concentrate Other (See Comments)    Can not take with her medications lessens effect   Grapefruit Extract Other (See Comments)    Lessens effect of medications   Vancomycin Itching   Voltaren [Diclofenac Sodium] Itching   Wound Dressings Hives, Itching and Rash    Medical tape in general     ROS  As noted in HPI.   Physical Exam  BP 111/80 (BP Location: Left Arm)   Pulse 96   Temp 98.7 F (37.1 C) (Oral)   Resp 18   Wt 70.3 kg   SpO2 100%   BMI 24.28 kg/m   Constitutional:  Well developed, well nourished, no acute distress Eyes: PERRL, EOMI, conjunctiva normal bilaterally HENT: Normocephalic, atraumatic,mucus membranes moist.  Normal turbinates.  Minimal nasal congestion.  No maxillary, frontal sinus tenderness.  Normal oropharynx.  No postnasal drip, cobblestoning. Neck.  Positive cervical lymphadenopathy. Respiratory: Clear to auscultation bilaterally, no rales, no wheezing, no rhonchi.  Positive anterior chest wall tenderness Cardiovascular: Normal rate and rhythm, no murmurs, no gallops, no rubs GI: nondistended skin: No rash, skin intact Musculoskeletal: no  deformities Neurologic: Alert & oriented x 3, CN III-XII grossly intact, no motor deficits, sensation grossly intact Psychiatric: Speech and behavior appropriate   ED Course   Medications - No data to display  Orders Placed This Encounter  Procedures   DG Chest 2 View    Standing Status:   Standing    Number of Occurrences:   1    Reason for Exam (SYMPTOM  OR DIAGNOSIS REQUIRED):   cogh fever x 6 days. h/o asthma  r/o PNA acute cardiopulmonary dz   No results found for this or any previous visit (from the past 24 hours). DG Chest 2 View Result Date: 04/22/2024 CLINICAL DATA:  Cough and fever.  Chest congestion. EXAM: CHEST - 2 VIEW COMPARISON:  04/25/2023 FINDINGS: Vagal nerve stimulator device, intervally changed out since the prior radiograph of 04/25/2023. The lungs appear clear. Cardiac and mediastinal contours normal. No blunting of the costophrenic angles. IMPRESSION: 1. No active cardiopulmonary disease is radiographically apparent. 2. Vagal nerve stimulator device. Electronically Signed   By: Ryan Salvage M.D.   On: 04/22/2024 11:19    ED Clinical Impression  1. COVID-19 virus infection   2. Acute cough   3. Persistent asthma with acute exacerbation, unspecified asthma severity      ED Assessment/Plan     Patient has been sick for 6 days, and is starting to have new fevers.   She tested positive for COVID 3 days ago, I suspect that her symptoms are originally from her COVID infection and unfortunately she is out of the antiviral treatment window.  Will check a chest x-ray to evaluate for pneumonia, however, clinically she has a pneumonia.  Concern for secondary asthma exacerbation.  Will send home with doxycycline  100 mg p.o. twice daily for 5 days and will consider adding cefdinir  300 mg twice daily for 5 days if x-ray is positive for pneumonia.  She has tolerated cephalosporins before per mother.  Regularly scheduled albuterol  inhaler with a spacer for 4 days, then as needed thereafter, prednisone  40 mg for 5 days for asthma exacerbation.  She does not need a refill on her inhaler or spacer.  Flonase , start saline nasal irrigation. Mucinex D, continue meloxicam , may take 1000 mg Tylenol  3-4 times a day as needed for fevers, body aches, etc.  Discontinue other cold medications.  Promethazine  DM for cough.  Diflucan  in case she gets a yeast infection  Reviewed imaging independently.  No acute cardiopulmonary disease.  Formal radiology overread pending.  Will contact patient at [phone] if radiology overread differs enough mine and we need to change management..   Reviewed radiology report.  No acute cardiopulmonary disease consistent with my read.  See radiology report for full details.  Discussed  imaging, MDM, treatment plan, and plan for follow-up with patient Discussed sn/sx that should prompt return to the ED. patient agrees with plan.   Meds ordered this encounter  Medications   doxycycline  (VIBRAMYCIN ) 100 MG capsule    Sig: Take 1 capsule (100 mg total) by mouth 2 (two) times daily for 5 days.    Dispense:  10 capsule    Refill:  0   predniSONE  (DELTASONE ) 20 MG tablet    Sig: Take 2 tablets (40 mg total) by mouth daily with breakfast for 5 days.    Dispense:  10 tablet    Refill:  0   promethazine -dextromethorphan (PROMETHAZINE -DM) 6.25-15 MG/5ML syrup     Sig: Take 5 mLs by mouth 4 (four) times daily as needed  for cough.    Dispense:  118 mL    Refill:  0   fluconazole  (DIFLUCAN ) 150 MG tablet    Sig: Take 1 tablet (150 mg total) by mouth once for 1 dose. 1 tab po x 1. May repeat in 72 hours if no improvement    Dispense:  2 tablet    Refill:  1      *This clinic note was created using Scientist, clinical (histocompatibility and immunogenetics). Therefore, there may be occasional mistakes despite careful proofreading. ?    Van Knee, MD 04/26/24 1034

## 2024-04-22 NOTE — Discharge Instructions (Addendum)
 I did not appreciate any pneumonia on your x-ray.  We will contact you if the radiology overread differs enough from mine and we need to change management.  Doxycycline  100 mg p.o. twice daily for 5 days.  Finish this, even if you feel better.  2 puffs from your albuterol  inhaler using your spacer every 4 hours for 2 days, then every 6 hours for 2 days, and then as needed.  May back off any albuterol  if you start to improve sooner.  Discontinue albuterol  if it triggers off SVT.  Prednisone  40 mg for 5 days for asthma exacerbation.  Continue Flonase , start saline nasal irrigation with a NeilMed sinus rinse and distilled water as often as you want. Mucinex D, continue meloxicam , may take 1000 mg Tylenol  3-4 times a day as needed for fevers, body aches, etc.  Discontinue other cold medications.  Promethazine  DM for cough.

## 2024-04-22 NOTE — ED Triage Notes (Signed)
 Patient states that she tested positive for Covid sat. Mom states that chest congestion started 6 days ago. Productive cough green. Patient is asking for ABX for chest congestion. Taking tylenol  and Ibu

## 2024-04-24 ENCOUNTER — Other Ambulatory Visit: Payer: Self-pay

## 2024-04-24 ENCOUNTER — Encounter: Payer: Self-pay | Admitting: Family Medicine

## 2024-04-24 DIAGNOSIS — L918 Other hypertrophic disorders of the skin: Secondary | ICD-10-CM

## 2024-04-24 NOTE — Progress Notes (Signed)
 Dermatology referral placed for skin tag. Okay per Dr. Alvia.  JM

## 2024-04-24 NOTE — Telephone Encounter (Signed)
 Do you need to see her for this issue before a referral is placed?  JM

## 2024-05-01 ENCOUNTER — Encounter: Payer: Self-pay | Admitting: Family Medicine

## 2024-05-08 ENCOUNTER — Encounter: Payer: Self-pay | Admitting: Family Medicine

## 2024-05-08 ENCOUNTER — Ambulatory Visit: Admitting: Family Medicine

## 2024-05-08 ENCOUNTER — Encounter: Admitting: Gastroenterology

## 2024-05-08 VITALS — BP 100/78 | HR 113 | Ht 67.0 in | Wt 155.0 lb

## 2024-05-08 DIAGNOSIS — G5601 Carpal tunnel syndrome, right upper limb: Secondary | ICD-10-CM | POA: Insufficient documentation

## 2024-05-08 MED ORDER — CELECOXIB 100 MG PO CAPS: 100.0000 mg | ORAL_CAPSULE | Freq: Two times a day (BID) | ORAL | 0 refills | Status: DC | PRN

## 2024-05-08 NOTE — Progress Notes (Signed)
 Primary Care / Sports Medicine Office Visit  Patient Information:  Patient ID: Marsela Kuan, female DOB: 01/14/2001 Age: 23 y.o. MRN: [id number]   Juliza Machnik is a pleasant 23 y.o. female presenting with the following:  Chief Complaint  Patient presents with   Wrist Pain    Right wrist pain since patient started back school as she is having to write often. She has been using brace, taking tylenol , ibuprofen , and meloxicam  daily. She is hoping to find another treatment option because her current treatment is not helping.     Vitals:   05/08/24 0938  BP: 100/78  Pulse: (!) 113  SpO2: 98%   Vitals:   05/08/24 0938  Weight: 155 lb (70.3 kg)  Height: 5' 7 (1.702 m)   Body mass index is 24.28 kg/m.  DG Chest 2 View Result Date: 04/22/2024 CLINICAL DATA:  Cough and fever.  Chest congestion. EXAM: CHEST - 2 VIEW COMPARISON:  04/25/2023 FINDINGS: Vagal nerve stimulator device, intervally changed out since the prior radiograph of 04/25/2023. The lungs appear clear. Cardiac and mediastinal contours normal. No blunting of the costophrenic angles. IMPRESSION: 1. No active cardiopulmonary disease is radiographically apparent. 2. Vagal nerve stimulator device. Electronically Signed   By: Ryan Salvage M.D.   On: 04/22/2024 11:19     Discussed the use of AI scribe software for clinical note transcription with the patient, who gave verbal consent to proceed.   Independent interpretation of notes and tests performed by another provider:   None  Procedures performed:   None  Pertinent History, Exam, Impression, and Recommendations:   Problem List Items Addressed This Visit     Carpal tunnel syndrome on right - Primary   History of Present Illness Oliver Neuwirth is a 23 year old female who presents with worsening right hand pain and numbness.  Right hand pain - Worsening pain in the right hand for the past three weeks, onset after return to school six  weeks ago - Pain is constant and described as stabbing, especially in the middle of the hand - Pain is worse when not wearing a brace - Symptoms are exacerbated by activities such as writing notes for school - Use of a brace intermittently and heating pad for relief - Ibuprofen  every six hours (one pill in the morning), meloxicam  at night, and Tylenol  every four hours for pain management - Initial prescription of meloxicam  and Flexeril  for neck and wrist issues  Right hand paresthesia and numbness - Tingling sensation in the fingers initially, progressing to numbness - Numbness involves the thumb, index, middle, and sometimes ring fingers - Symptoms have led to reduction in activities such as pushing  Functional impairment - Symptoms have impacted ability to write notes for school - Currently a student and has reduced certain activities due to symptoms  Physical Exam Right Wrist / Hand Inspection: No thenar eminence atrophy. No swelling, erythema, or deformity. Skin intact. Palpation: No focal tenderness at wrist or hand. No masses or nodules palpated. Range of Motion: Full and symmetric wrist flexion, extension, radial and ulnar deviation without pain. Full finger motion. Strength: 5/5 grip strength and thumb opposition. No weakness in abductor pollicis brevis. Neurovascular: Sensation intact to light touch in radial, ulnar, and median distributions. Capillary refill brisk. Radial and ulnar pulses palpable. Special Tests: Tinel's sign positive at the volar wrist. Phalen's maneuver positive. Findings consistent with median nerve compression at the carpal tunnel.  Assessment and Plan Right carpal tunnel  syndrome - Discontinue meloxicam . - Prescribe Celebrex  twice daily with food for one week, then as needed. - Instruct to wear wrist brace day and night, removing only for activities like hand washing and driving. - Provide exercises to start in one to two weeks as symptoms improve. -  Schedule follow-up in four weeks to assess progress and consider corticosteroid injection with ultrasound guidance if symptoms persist.      Relevant Medications   celecoxib  (CELEBREX ) 100 MG capsule     Orders & Medications Medications:  Meds ordered this encounter  Medications   celecoxib  (CELEBREX ) 100 MG capsule    Sig: Take 1 capsule (100 mg total) by mouth 2 (two) times daily as needed. X 1 week then BID PRN    Dispense:  60 capsule    Refill:  0   No orders of the defined types were placed in this encounter.    Return in about 4 weeks (around 06/05/2024), or procedure visit f/u CTS.     Selinda JINNY Ku, MD, [facility]   Primary Care Sports Medicine Primary Care and Sports Medicine at [facility]

## 2024-05-08 NOTE — Patient Instructions (Signed)
 VISIT SUMMARY:  Today, we addressed your worsening right hand pain and numbness, which has been affecting your daily activities and schoolwork. We confirmed that you have carpal tunnel syndrome and provided a new treatment plan to help manage your symptoms.  YOUR PLAN:  RIGHT CARPAL TUNNEL SYNDROME: You have carpal tunnel syndrome, which is causing pain and numbness in your right hand. -Stop taking meloxicam . -Start taking Celebrex  twice daily with food for one week, then as needed. -Wear your wrist brace day and night, removing it only for activities like hand washing and driving. -Begin exercises in one to two weeks as your symptoms improve. -Follow up in four weeks to check your progress. If symptoms persist, we may consider a corticosteroid injection with ultrasound guidance.

## 2024-05-08 NOTE — Assessment & Plan Note (Signed)
 History of Present Illness Jill Woodard is a 23 year old female who presents with worsening right hand pain and numbness.  Right hand pain - Worsening pain in the right hand for the past three weeks, onset after return to school six weeks ago - Pain is constant and described as stabbing, especially in the middle of the hand - Pain is worse when not wearing a brace - Symptoms are exacerbated by activities such as writing notes for school - Use of a brace intermittently and heating pad for relief - Ibuprofen  every six hours (one pill in the morning), meloxicam  at night, and Tylenol  every four hours for pain management - Initial prescription of meloxicam  and Flexeril  for neck and wrist issues  Right hand paresthesia and numbness - Tingling sensation in the fingers initially, progressing to numbness - Numbness involves the thumb, index, middle, and sometimes ring fingers - Symptoms have led to reduction in activities such as pushing  Functional impairment - Symptoms have impacted ability to write notes for school - Currently a student and has reduced certain activities due to symptoms  Physical Exam Right Wrist / Hand Inspection: No thenar eminence atrophy. No swelling, erythema, or deformity. Skin intact. Palpation: No focal tenderness at wrist or hand. No masses or nodules palpated. Range of Motion: Full and symmetric wrist flexion, extension, radial and ulnar deviation without pain. Full finger motion. Strength: 5/5 grip strength and thumb opposition. No weakness in abductor pollicis brevis. Neurovascular: Sensation intact to light touch in radial, ulnar, and median distributions. Capillary refill brisk. Radial and ulnar pulses palpable. Special Tests: Tinel's sign positive at the volar wrist. Phalen's maneuver positive. Findings consistent with median nerve compression at the carpal tunnel.  Assessment and Plan Right carpal tunnel syndrome - Discontinue meloxicam . -  Prescribe Celebrex  twice daily with food for one week, then as needed. - Instruct to wear wrist brace day and night, removing only for activities like hand washing and driving. - Provide exercises to start in one to two weeks as symptoms improve. - Schedule follow-up in four weeks to assess progress and consider corticosteroid injection with ultrasound guidance if symptoms persist.

## 2024-05-12 ENCOUNTER — Encounter: Payer: Self-pay | Admitting: Family Medicine

## 2024-05-13 NOTE — Telephone Encounter (Signed)
Please review and advise patient.

## 2024-05-15 ENCOUNTER — Other Ambulatory Visit: Payer: Self-pay | Admitting: Family Medicine

## 2024-05-15 DIAGNOSIS — R400 Somnolence: Secondary | ICD-10-CM

## 2024-05-15 NOTE — Telephone Encounter (Signed)
 PT response.  JM

## 2024-05-21 ENCOUNTER — Encounter: Payer: Self-pay | Admitting: Family Medicine

## 2024-05-21 NOTE — Telephone Encounter (Signed)
 Please review and advise.  JM

## 2024-05-22 ENCOUNTER — Encounter: Payer: Self-pay | Admitting: Gastroenterology

## 2024-05-22 ENCOUNTER — Ambulatory Visit: Admitting: Gastroenterology

## 2024-05-22 VITALS — BP 112/80 | HR 95 | Temp 98.3°F | Resp 16 | Ht 67.0 in | Wt 155.0 lb

## 2024-05-22 DIAGNOSIS — Z87898 Personal history of other specified conditions: Secondary | ICD-10-CM | POA: Diagnosis not present

## 2024-05-22 DIAGNOSIS — K121 Other forms of stomatitis: Secondary | ICD-10-CM | POA: Diagnosis not present

## 2024-05-22 DIAGNOSIS — F419 Anxiety disorder, unspecified: Secondary | ICD-10-CM | POA: Diagnosis not present

## 2024-05-22 DIAGNOSIS — Z9682 Presence of neurostimulator: Secondary | ICD-10-CM | POA: Diagnosis not present

## 2024-05-22 DIAGNOSIS — K219 Gastro-esophageal reflux disease without esophagitis: Secondary | ICD-10-CM

## 2024-05-22 DIAGNOSIS — R569 Unspecified convulsions: Secondary | ICD-10-CM | POA: Diagnosis not present

## 2024-05-22 DIAGNOSIS — R000 Tachycardia, unspecified: Secondary | ICD-10-CM | POA: Diagnosis not present

## 2024-05-22 DIAGNOSIS — K2950 Unspecified chronic gastritis without bleeding: Secondary | ICD-10-CM | POA: Diagnosis not present

## 2024-05-22 DIAGNOSIS — G5600 Carpal tunnel syndrome, unspecified upper limb: Secondary | ICD-10-CM

## 2024-05-22 HISTORY — DX: Carpal tunnel syndrome, unspecified upper limb: G56.00

## 2024-05-22 MED ORDER — SODIUM CHLORIDE 0.9 % IV SOLN: 500.0000 mL | Freq: Once | INTRAVENOUS | Status: DC

## 2024-05-22 MED ORDER — ESOMEPRAZOLE MAGNESIUM 40 MG PO CPDR: 40.0000 mg | DELAYED_RELEASE_CAPSULE | Freq: Two times a day (BID) | ORAL | 6 refills | Status: AC

## 2024-05-22 NOTE — Progress Notes (Signed)
 Called to room to assist during endoscopic procedure.  Patient ID and intended procedure confirmed with present staff. Received instructions for my participation in the procedure from the performing physician.

## 2024-05-22 NOTE — Progress Notes (Signed)
 Vss nad trans to pacu
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
## 2024-05-22 NOTE — Progress Notes (Signed)
Updated medical record with pt

## 2024-05-22 NOTE — Patient Instructions (Addendum)
  Resume previous diet  Continue present medications  Awaiting pathology results   YOU HAD AN ENDOSCOPIC PROCEDURE TODAY AT THE [facility]:   Refer to the procedure report that was given to you for any specific questions about what was found during the examination.  If the procedure report does not answer your questions, please call your gastroenterologist to clarify.  If you requested that your care partner not be given the details of your procedure findings, then the procedure report has been included in a sealed envelope for you to review at your convenience later.  YOU SHOULD EXPECT: Some feelings of bloating in the abdomen. Passage of more gas than usual.  Walking can help get rid of the air that was put into your GI tract during the procedure and reduce the bloating. If you had a lower endoscopy (such as a colonoscopy or flexible sigmoidoscopy) you may notice spotting of blood in your stool or on the toilet paper. If you underwent a bowel prep for your procedure, you may not have a normal bowel movement for a few days.  Please Note:  You might notice some irritation and congestion in your nose or some drainage.  This is from the oxygen used during your procedure.  There is no need for concern and it should clear up in a day or so.  SYMPTOMS TO REPORT IMMEDIATELY:  Following upper endoscopy (EGD)  Vomiting of blood or coffee ground material  New chest pain or pain under the shoulder blades  Painful or persistently difficult swallowing  New shortness of breath  Fever of 100F or higher  Black, tarry-looking stools  For urgent or emergent issues, a gastroenterologist can be reached at any hour by calling [phone]. Do not use MyChart messaging for urgent concerns.    DIET:  We do recommend a small meal at first, but then you may proceed to your regular diet.  Drink plenty of fluids but you should avoid alcoholic beverages for 24 hours.  ACTIVITY:  You should plan to  take it easy for the rest of today and you should NOT DRIVE or use heavy machinery until tomorrow (because of the sedation medicines used during the test).    FOLLOW UP: Our staff will call the number listed on your records the next business day following your procedure.  We will call around 7:15- 8:00 am to check on you and address any questions or concerns that you may have regarding the information given to you following your procedure. If we do not reach you, we will leave a message.     If any biopsies were taken you will be contacted by phone or by letter within the next 1-3 weeks.  Please call us at [phone] if you have not heard about the biopsies in 3 weeks.    SIGNATURES/CONFIDENTIALITY: You and/or your care partner have signed paperwork which will be entered into your electronic medical record.  These signatures attest to the fact that that the information above on your After Visit Summary has been reviewed and is understood.  Full responsibility of the confidentiality of this discharge information lies with you and/or your care-partner.

## 2024-05-22 NOTE — Op Note (Signed)
 [facility] Patient Name: Jill Woodard Procedure Date: 05/22/2024 2:09 PM MRN: [id number] Endoscopist: Aloha Finner , MD, [id number] Age: 23 Referring MD:  Date of Birth: 30-Aug-2000 Gender: Female Account #: [id number] Procedure:                Upper GI endoscopy Indications:              Heartburn, Exclusion of eosinophilic esophagitis,                            Mouth ulcers Medicines:                Monitored Anesthesia Care Procedure:                Pre-Anesthesia Assessment:                           - Prior to the procedure, a History and Physical                            was performed, and patient medications and                            allergies were reviewed. The patient's tolerance of                            previous anesthesia was also reviewed. The risks                            and benefits of the procedure and the sedation                            options and risks were discussed with the patient.                            All questions were answered, and informed consent                            was obtained. Prior Anticoagulants: The patient has                            taken no anticoagulant or antiplatelet agents                            except for NSAID medication. ASA Grade Assessment:                            II - A patient with mild systemic disease. After                            reviewing the risks and benefits, the patient was                            deemed in satisfactory condition to undergo the  procedure.                           After obtaining informed consent, the endoscope was                            passed under direct vision. Throughout the                            procedure, the patient's blood pressure, pulse, and                            oxygen saturations were monitored continuously. The                            GIF [id number] #[id number] was introduced through the                             mouth, and advanced to the second part of duodenum.                            The upper GI endoscopy was accomplished without                            difficulty. The patient tolerated the procedure. Scope In: Scope Out: Findings:                 No gross lesions were noted in the entire                            esophagus. Biopsies were taken with a cold forceps                            for histology to rule out EoE/LoE.                           The Z-line was regular and was found 38 cm from the                            incisors.                           Patchy mildly erythematous mucosa without bleeding                            was found in the gastric antrum.                           No other gross lesions were noted in the entire                            examined stomach. Biopsies were taken with a cold                            forceps for histology and  Helicobacter pylori                            testing.                           No gross lesions were noted in the duodenal bulb,                            in the first portion of the duodenum and in the                            second portion of the duodenum. Complications:            No immediate complications. Estimated Blood Loss:     Estimated blood loss was minimal. Impression:               - No gross lesions in the entire esophagus.                            Biopsied.                           - Z-line regular, 38 cm from the incisors.                           - Erythematous mucosa in the antrum. No other gross                            lesions in the entire stomach. Biopsied.                           - No gross lesions in the duodenal bulb, in the                            first portion of the duodenum and in the second                            portion of the duodenum. Recommendation:           - The patient will be observed post-procedure,                            until all  discharge criteria are met.                           - Discharge patient to home.                           - Patient has a contact number available for                            emergencies. The signs and symptoms of potential                            delayed complications were discussed with  the                            patient. Return to normal activities tomorrow.                            Written discharge instructions were provided to the                            patient.                           - Resume previous diet.                           - Consider switch to Nexium 40 mg twice daily to                            see if difference is noted. Would normally trial                            Aciphex/Dexilant but not clear if this is covered                            with her current insurance. Non-erosive reflux                            disease is possible so consider possible Voquenza                            use in future, if still having issues.                           - If we make our way through Voquenza and she is                            still having issues, then she needs a pH impedence                            test with her being off PPI therapy for us  to                            evaluate things further.                           - Etiology of recurring ulcerations in oropharynx                            is not clearly delineated with today's examination                            and so I am not convinced it is a result of true  acid reflux disease. Recommend an ENT evaluation                            when they are present to rule out other processes                            and biopsy as able.                           - The findings and recommendations were discussed                            with the patient.                           - The findings and recommendations were discussed                             with the patient's family. Aloha Finner, MD 05/22/2024 2:44:46 PM

## 2024-05-22 NOTE — Progress Notes (Signed)
 GASTROENTEROLOGY PROCEDURE H&P NOTE   Primary Care Physician: Alvia Selinda PARAS, MD  HPI: Jill Woodard is a 23 y.o. female who presents for EGD for evaluation of GERD and apthous ulcerations.  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 18 g 2   celecoxib  (CELEBREX ) 100 MG capsule Take 1 capsule (100 mg total) by mouth 2 (two) times daily as needed. X 1 week then BID PRN 60 capsule 0   cloBAZam (ONFI PO) Take 15 mg by mouth in the morning.     cloBAZam (ONFI) 10 MG tablet Take 20 mg by mouth at bedtime.     cyclobenzaprine  (FLEXERIL ) 5 MG tablet TAKE 1 TABLET BY MOUTH 3 TIMES DAILY AS NEEDED FOR MUSCLE SPASMS 90 tablet 0   fluticasone  (FLONASE ) 50 MCG/ACT nasal spray Place 2 sprays into both nostrils daily. 16 g 11   lamoTRIgine  (LAMICTAL ) 100 MG tablet Take by mouth 2 (two) times daily.     lidocaine  (XYLOCAINE ) 2 % solution Use as directed 15 mLs in the mouth or throat as needed for mouth pain. 100 mL 0   meloxicam  (MOBIC ) 15 MG tablet Take 15 mg by mouth at bedtime.     metoprolol  succinate (TOPROL  XL) 25 MG 24 hr tablet Take 1 tablet  (25 mg total) by mouth daily. 90 tablet 3   Multiple Vitamins-Minerals (MULTIVITAL PO) Take by mouth daily.     NAYZILAM 5 MG/0.1ML SOLN 3 times/day as needed-between meals & bedtime.     pantoprazole  (PROTONIX ) 40 MG tablet Take 1 tablet (40 mg total) by mouth 2 (two) times daily. 60 tablet 1   promethazine -dextromethorphan (PROMETHAZINE -DM) 6.25-15 MG/5ML syrup Take 5 mLs by mouth 4 (four) times daily as needed for cough. 118 mL 0   Semaglutide -Weight Management (WEGOVY ) 0.25 MG/0.5ML SOAJ Inject 0.25 mg into the skin once a week. 2 mL 2   sertraline  (ZOLOFT ) 50 MG tablet TAKE 1 TABLET BY MOUTH ONCE DAILY 90 tablet 0   traZODone  (DESYREL ) 50 MG tablet TAKE 1 TABLET BY MOUTH AT BEDTIME 90 tablet 0   Current Facility-Administered Medications  Medication Dose Route Frequency Provider Last Rate Last Admin   medroxyPROGESTERone  (DEPO-PROVERA ) injection 150 mg  150 mg Intramuscular Q90 days    150 mg at 03/28/24 0947    Current Outpatient Medications:    albuterol  (VENTOLIN  HFA) 108 (90 Base) MCG/ACT inhaler, Inhale 1-2 puffs into the lungs every 6 (six) hours as needed for wheezing or shortness of breath., Disp: 18 g, Rfl: 2   celecoxib  (CELEBREX ) 100 MG capsule, Take 1 capsule (100  mg total) by mouth 2 (two) times daily as needed. X 1 week then BID PRN, Disp: 60 capsule, Rfl: 0   cloBAZam (ONFI PO), Take 15 mg by mouth in the morning., Disp: , Rfl:    cloBAZam (ONFI) 10 MG tablet, Take 20 mg by mouth at bedtime., Disp: , Rfl:    cyclobenzaprine  (FLEXERIL ) 5 MG tablet, TAKE 1 TABLET BY MOUTH 3 TIMES DAILY AS NEEDED FOR MUSCLE SPASMS, Disp: 90 tablet, Rfl: 0   fluticasone  (FLONASE ) 50 MCG/ACT nasal spray, Place 2 sprays into both nostrils daily., Disp: 16 g, Rfl: 11   lamoTRIgine  (LAMICTAL ) 100 MG tablet, Take by mouth 2 (two) times daily., Disp: , Rfl:    lidocaine  (XYLOCAINE ) 2 % solution, Use as directed 15 mLs in the mouth or throat as needed for mouth pain., Disp: 100 mL, Rfl: 0   meloxicam   (MOBIC ) 15 MG tablet, Take 15 mg by mouth at bedtime., Disp: , Rfl:    metoprolol  succinate (TOPROL  XL) 25 MG 24 hr tablet, Take 1 tablet (25 mg total) by mouth daily., Disp: 90 tablet, Rfl: 3   Multiple Vitamins-Minerals (MULTIVITAL PO), Take by mouth daily., Disp: , Rfl:    NAYZILAM 5 MG/0.1ML SOLN, 3 times/day as needed-between meals & bedtime., Disp: , Rfl:    pantoprazole  (PROTONIX ) 40 MG tablet, Take 1 tablet (40 mg total) by mouth 2 (two) times daily., Disp: 60 tablet, Rfl: 1   promethazine -dextromethorphan (PROMETHAZINE -DM) 6.25-15 MG/5ML syrup, Take 5 mLs by mouth 4 (four) times daily as needed for cough., Disp: 118 mL, Rfl: 0   Semaglutide -Weight Management (WEGOVY ) 0.25 MG/0.5ML SOAJ, Inject 0.25 mg into the skin once a week., Disp: 2 mL, Rfl: 2   sertraline  (ZOLOFT ) 50 MG tablet, TAKE 1 TABLET BY MOUTH ONCE DAILY, Disp: 90 tablet, Rfl: 0   traZODone  (DESYREL ) 50 MG tablet, TAKE 1 TABLET BY MOUTH AT BEDTIME, Disp: 90 tablet, Rfl: 0  Current Facility-Administered Medications:    medroxyPROGESTERone  (DEPO-PROVERA ) injection 150 mg, 150 mg, Intramuscular, Q90 days, , 150 mg at 03/28/24 0947 Allergies  Allergen Reactions   Amoxicillin Rash and Hives   Dust Mite Extract Other (See Comments)   Other Hives    EEG Glue  Blisters, Red Rash  EEG Glue Blisters, Red Rash   Grapefruit Concentrate Other (See Comments)    Can not take with her medications lessens effect   Grapefruit Extract Other (See Comments)    Lessens effect of medications   Vancomycin Itching   Voltaren [Diclofenac Sodium] Itching   Wound Dressings Hives, Itching and Rash    Medical tape in general   Family History  Problem Relation Age of Onset   Anxiety disorder Mother    Arthritis Mother    Cancer Mother    Obesity Mother    Varicose Veins Mother    Ovarian cysts Mother    Alcohol abuse Father    Drug abuse Father    Depression Sister    ADD / ADHD Sister    Anxiety disorder Sister    Vision loss  Sister    Supraventricular tachycardia Sister    Arthritis Maternal Grandmother    Asthma Maternal Grandmother    Diabetes Maternal Grandmother    Hearing loss Maternal Grandmother    Obesity Maternal Grandmother    Cancer Maternal Grandfather    Varicose Veins Maternal Grandfather    Cancer Paternal Grandfather    Social History   Socioeconomic History   Marital status: Single    Spouse  name: Not on file   Number of children: 0   Years of education: Not on file   Highest education level: Some college, no degree  Occupational History   Occupation: receptionist  Tobacco Use   Smoking status: Never    Passive exposure: Never   Smokeless tobacco: Not on file  Vaping Use   Vaping status: Never Used  Substance and Sexual Activity   Alcohol use: No   Drug use: No   Sexual activity: Yes    Partners: Male    Birth control/protection: Injection  Other Topics Concern   Not on file  Social History Narrative   Not on file   Social Drivers of Health   Financial Resource Strain: Low Risk  (02/28/2024)   Overall Financial Resource Strain (CARDIA)    Difficulty of Paying Living Expenses: Not very hard  Food Insecurity: No Food Insecurity (02/28/2024)   Hunger Vital Sign    Worried About Running Out of Food in the Last Year: Never true    Ran Out of Food in the Last Year: Never true  Transportation Needs: No Transportation Needs (02/28/2024)   PRAPARE - Administrator, Civil Service (Medical): No    Lack of Transportation (Non-Medical): No  Physical Activity: Insufficiently Active (02/28/2024)   Exercise Vital Sign    Days of Exercise per Week: 4 days    Minutes of Exercise per Session: 10 min  Stress: No Stress Concern Present (02/28/2024)   [employer] of Occupational Health - Occupational Stress Questionnaire    Feeling of Stress: Only a little  Social Connections: Moderately Isolated (02/28/2024)   Social Connection and Isolation Panel    Frequency of  Communication with Friends and Family: More than three times a week    Frequency of Social Gatherings with Friends and Family: Twice a week    Attends Religious Services: Never    Database administrator or Organizations: Yes    Attends Engineer, structural: More than 4 times per year    Marital Status: Never married  Intimate Partner Violence: Not At Risk (03/28/2024)   Humiliation, Afraid, Rape, and Kick questionnaire    Fear of Current or Ex-Partner: No    Emotionally Abused: No    Physically Abused: No    Sexually Abused: No    Physical Exam: Today's Vitals   05/22/24 1327  BP: 128/82  Pulse: 98  Temp: 98.3 F (36.8 C)  TempSrc: Temporal  SpO2: 99%  Weight: 155 lb (70.3 kg)  Height: 5' 7 (1.702 m)   Body mass index is 24.28 kg/m. GEN: NAD EYE: Sclerae anicteric ENT: MMM CV: Non-tachycardic GI: Soft, NT/ND NEURO:  Alert & Oriented x 3  Lab Results: No results for input(s): WBC, HGB, HCT, PLT in the last 72 hours. BMET No results for input(s): NA, K, CL, CO2, GLUCOSE, BUN, CREATININE, CALCIUM in the last 72 hours. LFT No results for input(s): PROT, ALBUMIN, AST, ALT, ALKPHOS, BILITOT, BILIDIR, IBILI in the last 72 hours. PT/INR No results for input(s): LABPROT, INR in the last 72 hours.   Impression / Plan: This is a 23 y.o.female who presents for EGD for evaluation of GERD and apthous ulcerations.  The risks and benefits of endoscopic evaluation/treatment were discussed with the patient and/or family; these include but are not limited to the risk of perforation, infection, bleeding, missed lesions, lack of diagnosis, severe illness requiring hospitalization, as well as anesthesia and sedation related illnesses.  The patient's history has  been reviewed, patient examined, no change in status, and deemed stable for procedure.  The patient and/or family is agreeable to proceed.    Aloha Finner, MD [facility] Office # [phone]

## 2024-05-23 ENCOUNTER — Encounter: Admitting: Obstetrics

## 2024-05-23 ENCOUNTER — Telehealth: Payer: Self-pay

## 2024-05-23 NOTE — Telephone Encounter (Signed)
  Follow up Call-     05/22/2024    1:27 PM  Call back number  Post procedure Call Back phone  # [phone]  Permission to leave phone message Yes     Patient questions:  Do you have a fever, pain , or abdominal swelling? No. Pain Score  0 *  Have you tolerated food without any problems? Yes.    Have you been able to return to your normal activities? Yes.    Do you have any questions about your discharge instructions: Diet   No. Medications  No. Follow up visit  No.  Do you have questions or concerns about your Care? No.  Actions: * If pain score is 4 or above: No action needed, pain <4.

## 2024-05-27 LAB — SURGICAL PATHOLOGY

## 2024-05-28 ENCOUNTER — Encounter: Payer: Self-pay | Admitting: Family Medicine

## 2024-05-28 ENCOUNTER — Ambulatory Visit (INDEPENDENT_AMBULATORY_CARE_PROVIDER_SITE_OTHER): Admitting: Family Medicine

## 2024-05-28 VITALS — BP 110/70 | HR 104 | Ht 67.0 in | Wt 160.6 lb

## 2024-05-28 DIAGNOSIS — M778 Other enthesopathies, not elsewhere classified: Secondary | ICD-10-CM | POA: Diagnosis not present

## 2024-05-28 DIAGNOSIS — K2100 Gastro-esophageal reflux disease with esophagitis, without bleeding: Secondary | ICD-10-CM | POA: Diagnosis not present

## 2024-05-28 DIAGNOSIS — Z8669 Personal history of other diseases of the nervous system and sense organs: Secondary | ICD-10-CM

## 2024-05-28 DIAGNOSIS — G4709 Other insomnia: Secondary | ICD-10-CM

## 2024-05-28 DIAGNOSIS — K5904 Chronic idiopathic constipation: Secondary | ICD-10-CM

## 2024-05-28 DIAGNOSIS — Z0000 Encounter for general adult medical examination without abnormal findings: Secondary | ICD-10-CM | POA: Diagnosis not present

## 2024-05-28 NOTE — Assessment & Plan Note (Signed)
 Annual examination completed, risk stratification labs ordered, anticipatory guidance provided.  We will follow labs once resulted.

## 2024-05-28 NOTE — Assessment & Plan Note (Signed)
 Gastroesophageal reflux disease and mild gastritis GERD and mild gastritis likely due to excessive stomach acid. Awaiting H. pylori test results. - Continue esomeprazole (Nexium) twice daily, one hour before meals. - Await H. pylori test results. - Follow up with GI for further management based on H. pylori results.

## 2024-05-28 NOTE — Progress Notes (Signed)
 Annual Physical Exam Visit  Patient Information:  Patient ID: Jill Woodard, female DOB: [birth year] Age: 23 y.o. MRN: [id number]   Subjective:   CC: Annual Physical Exam  HPI:  Jill Woodard is here for their annual physical.  I reviewed the past medical history, family history, social history, surgical history, and allergies today and changes were made as necessary.  Please see the problem list section below for additional details.  Past Medical History: Past Medical History:  Diagnosis Date   Allergy    tape, wound dressing, tegaderm, sun sensitive   Anxiety    Asthma    Carpal tunnel syndrome 05/22/2024   Depression with anxiety 01/03/2023   Epilepsy (HCC)    Gastroesophageal reflux disease 11/22/2023   Healthcare maintenance 04/19/2023   Heart murmur 04/2023   SVT   Herpes    Oral ulceration 11/22/2023   Seizures (HCC) 2005   last 03/2023   Thyromegaly 04/19/2023   Unilateral partial vocal cord paralysis 2016   Partial left-sided vocal cord paralysis since 2016, possibly secondary   Past Surgical History: Past Surgical History:  Procedure Laterality Date   ADENOIDECTOMY     FINGER FRACTURE SURGERY     TYMPANOSTOMY TUBE PLACEMENT     VAGUS NERVE STIMULATOR GENERATOR CHANGE  04/16/2023   Battery change only   VAGUS NERVE STIMULATOR INSERTION  07/16/2015   Family History: Family History  Problem Relation Age of Onset   Anxiety disorder Mother    Arthritis Mother    Cancer Mother    Obesity Mother    Varicose Veins Mother    Ovarian cysts Mother    Alcohol abuse Father    Drug abuse Father    Depression Sister    ADD / ADHD Sister    Anxiety disorder Sister    Vision loss Sister    Supraventricular tachycardia Sister    Arthritis Maternal Grandmother    Asthma Maternal Grandmother    Diabetes Maternal Grandmother    Hearing loss Maternal Grandmother    Obesity Maternal Grandmother    Cancer Maternal Grandfather    Varicose Veins  Maternal Grandfather    Cancer Paternal Grandfather    Colon cancer Neg Hx    Esophageal cancer Neg Hx    Rectal cancer Neg Hx    Stomach cancer Neg Hx    Allergies: Allergies  Allergen Reactions   Amoxicillin Rash and Hives   Dust Mite Extract Other (See Comments)   Other Hives    EEG Glue  Blisters, Red Rash  EEG Glue Blisters, Red Rash   Grapefruit Concentrate Other (See Comments)    Can not take with her medications lessens effect   Grapefruit Extract Other (See Comments)    Lessens effect of medications   Vancomycin Itching   Voltaren [Diclofenac Sodium] Itching   Wound Dressings Hives, Itching and Rash    Medical tape in general   Health Maintenance: Health Maintenance  Topic Date Due   Influenza Vaccine  11/12/2024 (Originally 03/15/2024)   Cervical Cancer Screening (Pap smear)  05/28/2025 (Originally 05/22/2024)   Pneumococcal Vaccine (1 of 2 - PCV) 05/28/2025 (Originally 02/14/2021)   CHLAMYDIA SCREENING  05/28/2025 (Originally 04/18/2024)   HPV VACCINES (3 - Risk 3-dose series) 05/28/2025 (Originally 06/11/2016)   Meningococcal B Vaccine (1 of 2 - Standard) 05/28/2025 (Originally 07/16/2018)   DTaP/Tdap/Td (7 - Td or Tdap) 04/16/2025   Hepatitis B Vaccines 19-59 Average Risk  Completed   Hepatitis C Screening  Completed   HIV Screening  Completed   COVID-19 Vaccine  Discontinued    HM Colonoscopy   This patient has no relevant Health Maintenance data.    Medications: Current Outpatient Medications on File Prior to Visit  Medication Sig Dispense Refill   albuterol  (VENTOLIN  HFA) 108 (90 Base) MCG/ACT inhaler Inhale 1-2 puffs into the lungs every 6 (six) hours as needed for wheezing or shortness of breath. 18 g 2   celecoxib  (CELEBREX ) 100 MG capsule Take 1 capsule (100 mg total) by mouth 2 (two) times daily as needed. X 1 week then BID PRN 60 capsule 0   cloBAZam (ONFI PO) Take 15 mg by mouth in the morning.     cloBAZam (ONFI) 10 MG tablet Take 20 mg by mouth  at bedtime.     cyclobenzaprine  (FLEXERIL ) 5 MG tablet TAKE 1 TABLET BY MOUTH 3 TIMES DAILY AS NEEDED FOR MUSCLE SPASMS 90 tablet 0   esomeprazole (NEXIUM) 40 MG capsule Take 1 capsule (40 mg total) by mouth 2 (two) times daily before a meal. 60 capsule 6   fluticasone  (FLONASE ) 50 MCG/ACT nasal spray Place 2 sprays into both nostrils daily. 16 g 11   LamoTRIgine  200 MG TB24 24 hour tablet Take 1 tablet by mouth daily.     lidocaine  (XYLOCAINE ) 2 % solution Use as directed 15 mLs in the mouth or throat as needed for mouth pain. 100 mL 0   metoprolol  succinate (TOPROL  XL) 25 MG 24 hr tablet Take 1 tablet (25 mg total) by mouth daily. 90 tablet 3   Multiple Vitamins-Minerals (MULTIVITAL PO) Take by mouth daily.     NAYZILAM 5 MG/0.1ML SOLN 3 times/day as needed-between meals & bedtime.     promethazine -dextromethorphan (PROMETHAZINE -DM) 6.25-15 MG/5ML syrup Take 5 mLs by mouth 4 (four) times daily as needed for cough. 118 mL 0   Rimegepant Sulfate (NURTEC) 75 MG TBDP Take 75 mg by mouth as needed (Migraines).     Semaglutide -Weight Management (WEGOVY ) 0.25 MG/0.5ML SOAJ Inject 0.25 mg into the skin once a week. 2 mL 2   sertraline  (ZOLOFT ) 50 MG tablet TAKE 1 TABLET BY MOUTH ONCE DAILY 90 tablet 0   SUMAtriptan (IMITREX) 50 MG tablet Take by mouth.     traZODone  (DESYREL ) 50 MG tablet TAKE 1 TABLET BY MOUTH AT BEDTIME 90 tablet 0   Current Facility-Administered Medications on File Prior to Visit  Medication Dose Route Frequency Provider Last Rate Last Admin   medroxyPROGESTERone  (DEPO-PROVERA ) injection 150 mg  150 mg Intramuscular Q90 days    150 mg at 03/28/24 9052    Discussed the use of AI scribe software for clinical note transcription with the patient, who gave verbal consent to proceed.   Objective:   Vitals:   05/28/24 0841  BP: 110/70  Pulse: (!) 104  SpO2: 98%   Vitals:   05/28/24 0841  Weight: 160 lb 9.6 oz (72.8 kg)  Height: 5' 7 (1.702 m)   Body mass index is 25.15  kg/m.  General: Well Developed, well nourished, and in no acute distress.  Neuro: Alert and oriented x3, extra-ocular muscles intact, sensation grossly intact. Cranial nerves II through XII are grossly intact, motor, sensory, and coordinative functions are intact. HEENT: Normocephalic, atraumatic, neck supple, no masses, no lymphadenopathy, thyroid nonenlarged. Oropharynx, nasopharynx, external ear canals are unremarkable. Skin: Warm and dry, no rashes noted.  Cardiac: Regular rate and rhythm, no murmurs rubs or gallops. No peripheral edema. Pulses symmetric. Respiratory: Clear to auscultation  bilaterally. Speaking in full sentences.  Abdominal: Soft, mildly tender epigastric region and right lower quadrant, no rebound, nondistended, positive bowel sounds, no masses, no organomegaly. Musculoskeletal: Stable, and with full range of motion.   Impression and Recommendations:   The patient was counselled, risk factors were discussed, and anticipatory guidance given.  Problem List Items Addressed This Visit     Constipation   Constipation and bowel habits - Persistent constipation despite daily bowel movements, often with sensation of incomplete evacuation - Takes Metamucil twice daily for symptom management - Previous trial of Miralax resulted in nausea and was discontinued  Constipation Managed with Metamucil. Bowel movements not fully relieving. Miralax previously caused stomach upset. - Increase Metamucil dosage gradually. - Consider reintroducing Miralax at a lower dose if Metamucil is ineffective. - Ensure adequate hydration.      Gastroesophageal reflux disease   Gastroesophageal reflux disease and mild gastritis GERD and mild gastritis likely due to excessive stomach acid. Awaiting H. pylori test results. - Continue esomeprazole (Nexium) twice daily, one hour before meals. - Await H. pylori test results. - Follow up with GI for further management based on H. pylori results.       Healthcare maintenance - Primary   Annual examination completed, risk stratification labs ordered, anticipatory guidance provided.  We will follow labs once resulted.      Relevant Orders   CBC   Comprehensive metabolic panel with GFR   Hemoglobin A1c   Lipid panel   Hx of migraines   Cephalalgia and migraine symptoms - Severe headaches unresponsive to Tylenol , treated with as-needed Nurtec as prescribed by neurology - No recent mention of associated visual changes, photophobia, or aura  Migraine Intermittent migraines managed with Nurtec and Nayzilam. Nurtec effective at headache onset. - Continue Nurtec as needed for migraine management.      Other insomnia   Sleep disturbance - Takes Trazodone  for sleep - Continues to experience occasional nocturnal awakenings - Has follow-up with sleep medicine upcoming      Right wrist tendinitis   Right wrist pain - Right wrist pain, worsened with making a fist or lifting objects - Previous hand therapy provided temporary relief - Currently uses a wrist brace and takes medication for pain, but symptoms persist  Right wrist tendinitis  Persistent pain despite medication and therapy, worsened by movement and gripping. - Order MRI of the right wrist to evaluate structural integrity and rule out tendon or synovial issues. - Continue use of wrist brace until MRI results are available. - Consider procedure-based options, medications, and potential return to hand therapy based on MRI results.      Relevant Orders   MR WRIST RIGHT WO CONTRAST     Orders & Medications Medications: No orders of the defined types were placed in this encounter.  Orders Placed This Encounter  Procedures   MR WRIST RIGHT WO CONTRAST   CBC   Comprehensive metabolic panel with GFR   Hemoglobin A1c   Lipid panel     No follow-ups on file.    Jill JINNY Ku, MD, [facility]   Primary Care Sports Medicine [facility]  [place]

## 2024-05-28 NOTE — Assessment & Plan Note (Signed)
 Cephalalgia and migraine symptoms - Severe headaches unresponsive to Tylenol , treated with as-needed Nurtec as prescribed by neurology - No recent mention of associated visual changes, photophobia, or aura  Migraine Intermittent migraines managed with Nurtec and Nayzilam. Nurtec effective at headache onset. - Continue Nurtec as needed for migraine management.

## 2024-05-28 NOTE — Assessment & Plan Note (Addendum)
 Constipation and bowel habits - Persistent constipation despite daily bowel movements, often with sensation of incomplete evacuation - Takes Metamucil twice daily for symptom management - Previous trial of Miralax resulted in nausea and was discontinued  Constipation Managed with Metamucil. Bowel movements not fully relieving. Miralax previously caused stomach upset. - Increase Metamucil dosage gradually. - Consider reintroducing Miralax at a lower dose if Metamucil is ineffective. - Ensure adequate hydration.

## 2024-05-28 NOTE — Patient Instructions (Addendum)
-   Obtain fasting labs with orders provided (can have water or black coffee but otherwise no food or drink x 8 hours before labs) - Review information provided - Attend eye doctor annually, dentist every 6 months, work towards or maintain 30 minutes of moderate intensity physical activity at least 5 days per week, and consume a balanced diet - Return in 1 year for physical - Contact us  for any questions between now and then   VISIT SUMMARY:  During your visit, we discussed your ongoing issues with migraines, GERD, constipation, wrist pain, sleep disturbances, and palpitations. We reviewed your current treatments and made some adjustments to better manage your symptoms.  YOUR PLAN:  RIGHT WRIST PAIN: Persistent pain despite medication and therapy, worsened by movement and gripping. -We will order an MRI of your right wrist to check for any structural issues. -Continue using your wrist brace until we get the MRI results. -Based on the MRI results, we may consider different symptom control options and possibly returning to hand therapy.  MIGRAINE: Intermittent migraines managed with Nurtec and Nayzilam. -Continue taking Nurtec as needed for migraine management.  GASTROESOPHAGEAL REFLUX DISEASE AND MILD GASTRITIS: GERD and mild gastritis likely due to excessive stomach acid. Awaiting H. pylori test results. -Continue taking Nexium (esomeprazole) twice daily, one hour before meals. -We will wait for the H. pylori test results and follow up with GI for further management based on those results.  CONSTIPATION: Managed with Metamucil. Bowel movements not fully relieving. Miralax previously caused stomach upset. -Gradually increase your Metamucil dosage. -Consider reintroducing Miralax at a lower dose if Metamucil is not effective. -Make sure you are drinking enough water.  INSOMNIA: Managed with trazodone . Full tablet effective, but occasionally wakes up at night. -Continue taking trazodone  as  prescribed. -Discuss your sleep issues with sleep medicine specialist at your upcoming visit.  NONTOXIC LEFT THYROID NODULE: Under surveillance, previous CT from ENT showed no change in size. -Continue surveillance with endocrinology. -Follow up in one year from your last biopsy.

## 2024-05-28 NOTE — Assessment & Plan Note (Signed)
 Right wrist pain - Right wrist pain, worsened with making a fist or lifting objects - Previous hand therapy provided temporary relief - Currently uses a wrist brace and takes medication for pain, but symptoms persist  Right wrist tendinitis  Persistent pain despite medication and therapy, worsened by movement and gripping. - Order MRI of the right wrist to evaluate structural integrity and rule out tendon or synovial issues. - Continue use of wrist brace until MRI results are available. - Consider procedure-based options, medications, and potential return to hand therapy based on MRI results.

## 2024-05-28 NOTE — Assessment & Plan Note (Signed)
 Sleep disturbance - Takes Trazodone  for sleep - Continues to experience occasional nocturnal awakenings - Has follow-up with sleep medicine upcoming

## 2024-05-29 ENCOUNTER — Encounter: Payer: Self-pay | Admitting: Sleep Medicine

## 2024-05-29 ENCOUNTER — Ambulatory Visit: Payer: Self-pay | Admitting: Gastroenterology

## 2024-05-29 ENCOUNTER — Ambulatory Visit: Admitting: Sleep Medicine

## 2024-05-29 VITALS — BP 116/66 | HR 99 | Temp 98.0°F | Ht 67.0 in | Wt 160.0 lb

## 2024-05-29 DIAGNOSIS — F5104 Psychophysiologic insomnia: Secondary | ICD-10-CM | POA: Diagnosis not present

## 2024-05-29 DIAGNOSIS — G4733 Obstructive sleep apnea (adult) (pediatric): Secondary | ICD-10-CM | POA: Diagnosis not present

## 2024-05-29 LAB — CBC
Hematocrit: 40.6 % (ref 34.0–46.6)
Hemoglobin: 13 g/dL (ref 11.1–15.9)
MCH: 27.4 pg (ref 26.6–33.0)
MCHC: 32 g/dL (ref 31.5–35.7)
MCV: 86 fL (ref 79–97)
Platelets: 376 x10E3/uL (ref 150–450)
RBC: 4.74 x10E6/uL (ref 3.77–5.28)
RDW: 13.6 % (ref 11.7–15.4)
WBC: 6.2 x10E3/uL (ref 3.4–10.8)

## 2024-05-29 LAB — COMPREHENSIVE METABOLIC PANEL WITH GFR
ALT: 18 IU/L (ref 0–32)
AST: 19 IU/L (ref 0–40)
Albumin: 4.6 g/dL (ref 4.0–5.0)
Alkaline Phosphatase: 71 IU/L (ref 41–116)
BUN/Creatinine Ratio: 14 (ref 9–23)
BUN: 10 mg/dL (ref 6–20)
Bilirubin Total: 0.3 mg/dL (ref 0.0–1.2)
CO2: 22 mmol/L (ref 20–29)
Calcium: 9.8 mg/dL (ref 8.7–10.2)
Chloride: 104 mmol/L (ref 96–106)
Creatinine, Ser: 0.7 mg/dL (ref 0.57–1.00)
Globulin, Total: 3 g/dL (ref 1.5–4.5)
Glucose: 96 mg/dL (ref 70–99)
Potassium: 4.6 mmol/L (ref 3.5–5.2)
Sodium: 140 mmol/L (ref 134–144)
Total Protein: 7.6 g/dL (ref 6.0–8.5)
eGFR: 125 mL/min/1.73 (ref >59)

## 2024-05-29 LAB — HEMOGLOBIN A1C
Est. average glucose Bld gHb Est-mCnc: 100 mg/dL
Hgb A1c MFr Bld: 5.1 % (ref 4.8–5.6)

## 2024-05-29 LAB — LIPID PANEL
Chol/HDL Ratio: 3.9 ratio (ref 0.0–4.4)
Cholesterol, Total: 175 mg/dL (ref 100–199)
HDL: 45 mg/dL (ref >39)
LDL Chol Calc (NIH): 112 mg/dL — ABNORMAL HIGH (ref 0–99)
Triglycerides: 101 mg/dL (ref 0–149)
VLDL Cholesterol Cal: 18 mg/dL (ref 5–40)

## 2024-05-29 NOTE — Patient Instructions (Signed)
 [name]

## 2024-05-29 NOTE — Progress Notes (Signed)
 Name:Jill Woodard MRN: [id number] DOB: [birth year]   CHIEF COMPLAINT:  EXCESSIVE DAYTIME SLEEPINESS   HISTORY OF PRESENT ILLNESS: Jill Woodard is a 23 y.o. w/ a h/o epilepsy, anxiety, SVT, asthma who present for c/o witnessed apnea and excessive daytime sleepiness which has been present for several years. Reports nocturnal awakenings due to unclear reasons and has difficulty falling back to sleep. Reports a 20 lb weight loss over the last several months. Admits to dry mouth. Denies morning headaches, RLS symptoms, dream enactment, cataplexy, hypnagogic or hypnapompic hallucinations. Reports a family history of sleep apnea. Denies drowsy driving. Drinks 1 cup of coffee and 1 soda weekly, denies tobacco, alcohol or illicit drug use.   Bedtime 9:30-10 pm Sleep onset 45 mins Rise time 6:30 am   EPWORTH SLEEP SCORE     05/29/2024    9:12 AM  Results of the Epworth flowsheet  Sitting and reading 2  Watching TV 1  Sitting, inactive in a public place (e.g. a theatre or a meeting) 0  As a passenger in a car for an hour without a break 0  Lying down to rest in the afternoon when circumstances permit 3  Sitting and talking to someone 0  Sitting quietly after a lunch without alcohol 1  In a car, while stopped for a few minutes in traffic 0  Total score 7    PAST MEDICAL HISTORY :   has a past medical history of Allergy, Anxiety, Asthma, Carpal tunnel syndrome (05/22/2024), Depression with anxiety (01/03/2023), Epilepsy (HCC), Gastroesophageal reflux disease (11/22/2023), Healthcare maintenance (04/19/2023), Heart murmur (04/2023), Herpes, Oral ulceration (11/22/2023), Seizures (HCC) (2005), Thyromegaly (04/19/2023), and Unilateral partial vocal cord paralysis (2016).  has a past surgical history that includes Adenoidectomy; Finger fracture surgery; Tympanostomy tube placement; Vagus nerve stimulator generator change (04/16/2023); and Vagus nerve stimulator insertion  (07/16/2015). Prior to Admission medications   Medication Sig Start Date End Date Taking? Authorizing Provider  albuterol  (VENTOLIN  HFA) 108 (90 Base) MCG/ACT inhaler Inhale 1-2 puffs into the lungs every 6 (six) hours as needed for wheezing or shortness of breath. 04/06/23  Yes Alvia Selinda PARAS, MD  celecoxib  (CELEBREX ) 100 MG capsule Take 1 capsule (100 mg total) by mouth 2 (two) times daily as needed. X 1 week then BID PRN 05/08/24  Yes Matthews, Jason J, MD  cloBAZam (ONFI PO) Take 15 mg by mouth in the morning.   Yes [provider]  cloBAZam (ONFI) 10 MG tablet Take 20 mg by mouth at bedtime.   Yes [provider]  cyclobenzaprine  (FLEXERIL ) 5 MG tablet TAKE 1 TABLET BY MOUTH 3 TIMES DAILY AS NEEDED FOR MUSCLE SPASMS 03/17/23  Yes Alvia Selinda PARAS, MD  esomeprazole (NEXIUM) 40 MG capsule Take 1 capsule (40 mg total) by mouth 2 (two) times daily before a meal. 05/22/24  Yes Mansouraty, Aloha Raddle., MD  fluticasone  (FLONASE ) 50 MCG/ACT nasal spray Place 2 sprays into both nostrils daily. 12/13/23  Yes Matthews, Jason J, MD  LamoTRIgine  200 MG TB24 24 hour tablet Take 1 tablet by mouth daily.   Yes [provider]  lidocaine  (XYLOCAINE ) 2 % solution Use as directed 15 mLs in the mouth or throat as needed for mouth pain. 11/22/23  Yes Alvia Selinda PARAS, MD  metoprolol  succinate (TOPROL  XL) 25 MG 24 hr tablet Take 1 tablet (25 mg total) by mouth daily. 02/07/24 02/06/25 Yes Hammock, Tylene, NP  Multiple Vitamins-Minerals (MULTIVITAL PO) Take by mouth daily.   Yes  [provider]  NAYZILAM 5 MG/0.1ML SOLN 3 times/day as needed-between meals & bedtime. 06/14/22  Yes [provider]  promethazine -dextromethorphan (PROMETHAZINE -DM) 6.25-15 MG/5ML syrup Take 5 mLs by mouth 4 (four) times daily as needed for cough. 04/22/24  Yes Van Knee, MD  Rimegepant Sulfate (NURTEC) 75 MG TBDP Take 75 mg by mouth as needed (Migraines).   Yes [provider]   Semaglutide -Weight Management (WEGOVY ) 0.25 MG/0.5ML SOAJ Inject 0.25 mg into the skin once a week. 02/28/24  Yes Alvia Selinda PARAS, MD  sertraline  (ZOLOFT ) 50 MG tablet TAKE 1 TABLET BY MOUTH ONCE DAILY 01/24/24  Yes Matthews, Jason J, MD  SUMAtriptan (IMITREX) 50 MG tablet Take by mouth. 05/22/24  Yes [provider]  traZODone  (DESYREL ) 50 MG tablet TAKE 1 TABLET BY MOUTH AT BEDTIME 01/24/24  Yes Matthews, Jason J, MD   Allergies  Allergen Reactions   Amoxicillin Rash and Hives   Dust Mite Extract Other (See Comments)   Other Hives    EEG Glue  Blisters, Red Rash  EEG Glue Blisters, Red Rash   Grapefruit Concentrate Other (See Comments)    Can not take with her medications lessens effect   Grapefruit Extract Other (See Comments)    Lessens effect of medications   Vancomycin Itching   Voltaren [Diclofenac Sodium] Itching   Wound Dressings Hives, Itching and Rash    Medical tape in general    FAMILY HISTORY:  family history includes ADD / ADHD in her sister; Alcohol abuse in her father; Anxiety disorder in her mother and sister; Arthritis in her maternal grandmother and mother; Asthma in her maternal grandmother; Cancer in her maternal grandfather, mother, and paternal grandfather; Depression in her sister; Diabetes in her maternal grandmother; Drug abuse in her father; Hearing loss in her maternal grandmother; Obesity in her maternal grandmother and mother; Ovarian cysts in her mother; Supraventricular tachycardia in her sister; Varicose Veins in her maternal grandfather and mother; Vision loss in her sister. SOCIAL HISTORY:  reports that she has never smoked. She has never been exposed to tobacco smoke. She has never used smokeless tobacco. She reports that she does not drink alcohol and does not use drugs.   Review of Systems:  Gen:  Denies  fever, sweats, chills weight loss  HEENT: Denies blurred vision, double vision, ear pain, eye pain, hearing loss, nose bleeds,  sore throat Cardiac:  No dizziness, chest pain or heaviness, chest tightness,edema, No JVD Resp:   No cough, -sputum production, -shortness of breath,-wheezing, -hemoptysis,  Gi: Denies swallowing difficulty, stomach pain, nausea or vomiting, diarrhea, constipation, bowel incontinence Gu:  Denies bladder incontinence, burning urine Ext:   Denies Joint pain, stiffness or swelling Skin: Denies  skin rash, easy bruising or bleeding or hives Endoc:  Denies polyuria, polydipsia , polyphagia or weight change Psych:   Denies depression, insomnia or hallucinations  Other:  All other systems negative  VITAL SIGNS: BP 116/66   Pulse 99   Temp 98 F (36.7 C) (Temporal)   Ht 5' 7 (1.702 m)   Wt 160 lb (72.6 kg)   SpO2 97%   BMI 25.06 kg/m    Physical Examination:   General Appearance: No distress  EYES PERRLA, EOM intact.   NECK Supple, No JVD Pulmonary: normal breath sounds, No wheezing.  CardiovascularNormal S1,S2.  No m/r/g.   Abdomen: Benign, Soft, non-tender. Skin:   warm, no rashes, no ecchymosis  Extremities: normal, no cyanosis, clubbing. Neuro:without focal findings,  speech normal  PSYCHIATRIC:  Mood, affect within normal limits.   ASSESSMENT AND PLAN  OSA I suspect that OSA is likely present due to clinical presentation. Discussed the consequences of untreated sleep apnea. Advised not to drive drowsy for safety of patient and others. Will complete further evaluation with a home sleep study and follow up to review results.    Insomnia Counseled patient on stimulus control and improving sleep hygiene practices.    MEDICATION ADJUSTMENTS/LABS AND TESTS ORDERED: Recommend Sleep Study   Patient  satisfied with Plan of action and management. All questions answered  Follow up to review HST results and treatment plan.   I spent a total of 61 minutes reviewing chart data, face-to-face evaluation with the patient, counseling and coordination of care as detailed above.     Kayton Ripp, M.D.  Sleep Medicine [facility]

## 2024-05-30 ENCOUNTER — Ambulatory Visit: Payer: Self-pay | Admitting: Family Medicine

## 2024-06-04 ENCOUNTER — Encounter: Payer: Self-pay | Admitting: Family Medicine

## 2024-06-04 ENCOUNTER — Encounter: Payer: Self-pay | Admitting: Sleep Medicine

## 2024-06-04 DIAGNOSIS — F5104 Psychophysiologic insomnia: Secondary | ICD-10-CM

## 2024-06-04 DIAGNOSIS — G4733 Obstructive sleep apnea (adult) (pediatric): Secondary | ICD-10-CM

## 2024-06-04 NOTE — Telephone Encounter (Signed)
 The order has been sent to [facility]

## 2024-06-04 NOTE — Progress Notes (Unsigned)
   GYN ENCOUNTER  Subjective  HPI: Jill Woodard is a 23 y.o. G0P0000 who presents today to establish care and for contraceptive counseling and a f/u Pap smear. She has been on Depo for 10 years and would like to switch to a different method. She has a h/o epilepsy. She started on Depo initially to suppress her cycles, because her seizure activity was notably worse during menses. She has not had a seizure in over a year now. 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 She denies abnormal discharge or bleeding, pelvic pain, and dyspareunia. Sh  Past Medical History:  Diagnosis Date   Allergy    tape, wound dressing, tegaderm, sun sensitive   Anxiety    Asthma    Carpal tunnel syndrome 05/22/2024   Depression with anxiety 01/03/2023   Epilepsy (HCC)    Gastroesophageal reflux disease 11/22/2023   Healthcare maintenance 04/19/2023   Heart murmur 04/2023   SVT   Herpes    Oral ulceration 11/22/2023   Seizures (HCC) 2005   last 03/2023   Thyromegaly 04/19/2023   Unilateral partial vocal cord paralysis 2016   Partial left-sided vocal cord paralysis since 2016, possibly secondary   Past Surgical History:  Procedure Laterality Date   ADENOIDECTOMY     FINGER FRACTURE SURGERY     TYMPANOSTOMY TUBE PLACEMENT     VAGUS NERVE STIMULATOR GENERATOR CHANGE  04/16/2023   Battery change only   VAGUS NERVE STIMULATOR INSERTION  07/16/2015   OB History     Gravida  0   Para  0   Term  0   Preterm  0   AB  0   Living  0      SAB  0   IAB  0   Ectopic  0   Multiple  0   Live Births  0          Allergies  Allergen Reactions   Amoxicillin Rash and Hives   Dust Mite Extract Other (See Comments)   Other Hives    EEG Glue  Blisters, Red Rash  EEG Glue Blisters, Red Rash   Grapefruit Concentrate Other (See Comments)    Can not take with her medications lessens effect   Grapefruit Extract Other (See Comments)    Lessens effect of medications   Vancomycin Itching    Voltaren [Diclofenac Sodium] Itching   Wound Dressings Hives, Itching and Rash    Medical tape in general    ROS: See HPI   Objective  There were no vitals taken for this visit.  Physical examination   Pelvic:   Vulva: Normal appearance.  No lesions.  Vagina: No lesions or abnormalities noted.  Support: Normal pelvic support.  Urethra No masses tenderness or scarring.  Meatus Normal size without lesions or prolapse.  Cervix: Normal appearance.  No lesions. Pap collected.  Perineum: Normal exam.  No lesions.    Assessment -Desires contraceptive change -Pap smear due  Plan -Discussed progesterone-only contraceptive options since she is on lamotrigine . Reviewed risks and benefits of IUD, Nexplanon, and POPs. Berneda would like to try Nexplanon. Will schedule visit for Nexplanon placement next week. -Pap collected. Discussed f/u based on results.   Mychaela Lennartz, CNM

## 2024-06-05 ENCOUNTER — Ambulatory Visit: Admitting: Obstetrics

## 2024-06-05 ENCOUNTER — Other Ambulatory Visit (HOSPITAL_COMMUNITY)
Admission: RE | Admit: 2024-06-05 | Discharge: 2024-06-05 | Disposition: A | Discharge status: Home / Self Care | Source: Ambulatory Visit | Attending: Certified Nurse Midwife | Admitting: Certified Nurse Midwife

## 2024-06-05 ENCOUNTER — Encounter: Payer: Self-pay | Admitting: Obstetrics

## 2024-06-05 VITALS — BP 110/72 | HR 98 | Ht 67.0 in | Wt 160.0 lb

## 2024-06-05 DIAGNOSIS — Z7689 Persons encountering health services in other specified circumstances: Secondary | ICD-10-CM

## 2024-06-05 DIAGNOSIS — Z124 Encounter for screening for malignant neoplasm of cervix: Secondary | ICD-10-CM

## 2024-06-05 DIAGNOSIS — Z3009 Encounter for other general counseling and advice on contraception: Secondary | ICD-10-CM | POA: Diagnosis not present

## 2024-06-06 ENCOUNTER — Ambulatory Visit: Admitting: Family Medicine

## 2024-06-07 LAB — CYTOLOGY - PAP

## 2024-06-10 NOTE — Telephone Encounter (Signed)
 Please send patient MRI order to [facility] location. Thank you.  Jill Woodard

## 2024-06-10 NOTE — Telephone Encounter (Signed)
 This is where she would like [facility] to [facility] [place], [place], [place] [address]

## 2024-06-12 ENCOUNTER — Ambulatory Visit: Admitting: Certified Nurse Midwife

## 2024-06-12 ENCOUNTER — Ambulatory Visit: Payer: Self-pay | Admitting: Obstetrics

## 2024-06-12 ENCOUNTER — Encounter: Payer: Self-pay | Admitting: Obstetrics

## 2024-06-12 VITALS — BP 104/65 | HR 109 | Ht 67.0 in | Wt 160.0 lb

## 2024-06-12 DIAGNOSIS — Z30017 Encounter for initial prescription of implantable subdermal contraceptive: Secondary | ICD-10-CM

## 2024-06-12 MED ORDER — ETONOGESTREL 68 MG ~~LOC~~ IMPL
68.0000 mg | DRUG_IMPLANT | Freq: Once | SUBCUTANEOUS | Status: AC
  Administered 2024-06-12: 68 mg via SUBCUTANEOUS

## 2024-06-12 NOTE — Telephone Encounter (Signed)
 Discussed results of Pap with [facility]. This is her second LSIL result. Will repeat testing in one year and follow with colposcopy if ASCUS or higher, per ASCCP guidelines. Informed of yeast infection. She already has Diflucan  at home. Discussed possible HSV based on Pap results. She is unsure if she has HSV; she had vulvar lesions in the past and was told she has HSV and then told she does not have HSV. Counseled to return for testing/Valtrex  if she has another outbreak.  Darthula Desa M Tariah Transue, CNM

## 2024-06-12 NOTE — Progress Notes (Signed)
 ENCOUNTER FOR NEXPLANON INSERTION  SUBJECTIVE Jill Woodard is a 23 y.o. G0P0000 who presents today for insertion of Nexplanon contraceptive device. She desires reversible long-term contraception. We have thoroughly reviewed the risks, benefits, and alternatives, and she has elected to proceed with Nexplanon insertion.   OBJECTIVE BP 104/65   Pulse (!) 109   Ht 5' 7 (1.702 m)   Wt 160 lb (72.6 kg)   BMI 25.06 kg/m   UPT:  Procedure Note Left Arm Sterile Preparation: Chloraprep  Insertion site was selected 8 cm from the medial epicondyle. The procedure area was prepped in sterile fashion. Adequate anesthesia was achieved with 2 mL of 1% lidocaine  injected subcutaneously. The Nexplanon applicator was inserted subcutaneously and the Nexplanon device was delivered. The applicator was removed from the insertion site. The capsule was palpated by myself and the patient to confirm satisfactory placement. Blood loss was minimal. A pressure dressing was applied.  The patient tolerated the procedure well with no complications. Standard post-procedure care and return precautions were explained.   Damien Parsley, CNM

## 2024-06-13 ENCOUNTER — Other Ambulatory Visit: Payer: Self-pay

## 2024-06-13 ENCOUNTER — Encounter: Payer: Self-pay | Admitting: Family Medicine

## 2024-06-13 DIAGNOSIS — J329 Chronic sinusitis, unspecified: Secondary | ICD-10-CM | POA: Diagnosis not present

## 2024-06-13 DIAGNOSIS — J309 Allergic rhinitis, unspecified: Secondary | ICD-10-CM | POA: Diagnosis not present

## 2024-06-13 DIAGNOSIS — J4521 Mild intermittent asthma with (acute) exacerbation: Secondary | ICD-10-CM

## 2024-06-13 MED ORDER — ALBUTEROL SULFATE HFA 108 (90 BASE) MCG/ACT IN AERS: 1.0000 | INHALATION_SPRAY | Freq: Four times a day (QID) | RESPIRATORY_TRACT | 2 refills | Status: AC | PRN

## 2024-06-17 DIAGNOSIS — J301 Allergic rhinitis due to pollen: Secondary | ICD-10-CM | POA: Diagnosis not present

## 2024-06-18 ENCOUNTER — Other Ambulatory Visit (HOSPITAL_COMMUNITY): Payer: Self-pay

## 2024-06-18 ENCOUNTER — Encounter: Payer: Self-pay | Admitting: Family Medicine

## 2024-06-18 ENCOUNTER — Telehealth: Payer: Self-pay

## 2024-06-18 ENCOUNTER — Other Ambulatory Visit: Payer: Self-pay

## 2024-06-18 DIAGNOSIS — M549 Dorsalgia, unspecified: Secondary | ICD-10-CM

## 2024-06-18 NOTE — Progress Notes (Signed)
 Patient requested referral for Chiropractic care for adjustment of spine.  JM

## 2024-06-18 NOTE — Telephone Encounter (Signed)
   Copied from CRM #[id number]. Topic: Clinical - Request for Lab/Test Order >> Jun 18, 2024 12:07 PM Gustabo D wrote: [facility]-Can't do MRI of pt right wrist can't do it due nerve stimulator that the pt has [phone]

## 2024-06-19 ENCOUNTER — Other Ambulatory Visit: Payer: Self-pay

## 2024-06-19 ENCOUNTER — Encounter: Payer: Self-pay | Admitting: Gastroenterology

## 2024-06-19 DIAGNOSIS — D485 Neoplasm of uncertain behavior of skin: Secondary | ICD-10-CM | POA: Diagnosis not present

## 2024-06-19 DIAGNOSIS — M778 Other enthesopathies, not elsewhere classified: Secondary | ICD-10-CM

## 2024-06-19 NOTE — Telephone Encounter (Signed)
 Dr Wilhelmenia have you seen the addendum for H pylori

## 2024-06-19 NOTE — Telephone Encounter (Signed)
 Order placed. JM

## 2024-06-22 NOTE — Telephone Encounter (Signed)
 Patty, I would set her up for a follow-up in clinic with APP or myself. If the Nexium transition has not been helpful, with the possibility that this may be nonerosive reflux disease, we can see if insurance would give approval for Voquenza. Please give me an update as to what she feels in regards to heartburn symptoms. Thanks. GM

## 2024-06-25 ENCOUNTER — Ambulatory Visit: Attending: Sleep Medicine

## 2024-06-25 DIAGNOSIS — G4733 Obstructive sleep apnea (adult) (pediatric): Secondary | ICD-10-CM | POA: Insufficient documentation

## 2024-06-25 DIAGNOSIS — F5104 Psychophysiologic insomnia: Secondary | ICD-10-CM | POA: Insufficient documentation

## 2024-07-03 ENCOUNTER — Encounter: Payer: Self-pay | Admitting: Family Medicine

## 2024-07-03 DIAGNOSIS — M5418 Radiculopathy, sacral and sacrococcygeal region: Secondary | ICD-10-CM | POA: Diagnosis not present

## 2024-07-03 DIAGNOSIS — M62830 Muscle spasm of back: Secondary | ICD-10-CM | POA: Diagnosis not present

## 2024-07-03 DIAGNOSIS — M9904 Segmental and somatic dysfunction of sacral region: Secondary | ICD-10-CM | POA: Diagnosis not present

## 2024-07-03 DIAGNOSIS — R519 Headache, unspecified: Secondary | ICD-10-CM | POA: Diagnosis not present

## 2024-07-03 DIAGNOSIS — M5412 Radiculopathy, cervical region: Secondary | ICD-10-CM | POA: Diagnosis not present

## 2024-07-03 DIAGNOSIS — M9901 Segmental and somatic dysfunction of cervical region: Secondary | ICD-10-CM | POA: Diagnosis not present

## 2024-07-03 DIAGNOSIS — M545 Low back pain, unspecified: Secondary | ICD-10-CM | POA: Diagnosis not present

## 2024-07-03 DIAGNOSIS — M41127 Adolescent idiopathic scoliosis, lumbosacral region: Secondary | ICD-10-CM | POA: Diagnosis not present

## 2024-07-03 DIAGNOSIS — M9903 Segmental and somatic dysfunction of lumbar region: Secondary | ICD-10-CM | POA: Diagnosis not present

## 2024-07-03 DIAGNOSIS — M9902 Segmental and somatic dysfunction of thoracic region: Secondary | ICD-10-CM | POA: Diagnosis not present

## 2024-07-03 DIAGNOSIS — M546 Pain in thoracic spine: Secondary | ICD-10-CM | POA: Diagnosis not present

## 2024-07-04 ENCOUNTER — Other Ambulatory Visit: Payer: Self-pay

## 2024-07-04 DIAGNOSIS — G5601 Carpal tunnel syndrome, right upper limb: Secondary | ICD-10-CM

## 2024-07-04 MED ORDER — CELECOXIB 100 MG PO CAPS: 100.0000 mg | ORAL_CAPSULE | Freq: Two times a day (BID) | ORAL | 0 refills | Status: AC | PRN

## 2024-07-04 MED ORDER — VOQUEZNA 10 MG PO TABS: 10.0000 mg | ORAL_TABLET | Freq: Every day | ORAL | 6 refills | Status: AC

## 2024-07-04 NOTE — Addendum Note (Signed)
 Addended by: ANITRA ODETTA CROME on: 07/04/2024 01:12 PM   Modules accepted: Orders

## 2024-07-04 NOTE — Telephone Encounter (Signed)
 Patty, Let's get her in clinic. Let's see if we can get approval for Voquenza 10 mg daily. Please see if we can get approval or if PA is necessary. Thanks. GM

## 2024-07-05 ENCOUNTER — Telehealth: Payer: Self-pay

## 2024-07-05 DIAGNOSIS — M41127 Adolescent idiopathic scoliosis, lumbosacral region: Secondary | ICD-10-CM | POA: Diagnosis not present

## 2024-07-05 DIAGNOSIS — M546 Pain in thoracic spine: Secondary | ICD-10-CM | POA: Diagnosis not present

## 2024-07-05 DIAGNOSIS — M5418 Radiculopathy, sacral and sacrococcygeal region: Secondary | ICD-10-CM | POA: Diagnosis not present

## 2024-07-05 DIAGNOSIS — M9902 Segmental and somatic dysfunction of thoracic region: Secondary | ICD-10-CM | POA: Diagnosis not present

## 2024-07-05 DIAGNOSIS — M9904 Segmental and somatic dysfunction of sacral region: Secondary | ICD-10-CM | POA: Diagnosis not present

## 2024-07-05 DIAGNOSIS — M62830 Muscle spasm of back: Secondary | ICD-10-CM | POA: Diagnosis not present

## 2024-07-05 DIAGNOSIS — R519 Headache, unspecified: Secondary | ICD-10-CM | POA: Diagnosis not present

## 2024-07-05 DIAGNOSIS — M545 Low back pain, unspecified: Secondary | ICD-10-CM | POA: Diagnosis not present

## 2024-07-05 DIAGNOSIS — M9903 Segmental and somatic dysfunction of lumbar region: Secondary | ICD-10-CM | POA: Diagnosis not present

## 2024-07-05 DIAGNOSIS — M9901 Segmental and somatic dysfunction of cervical region: Secondary | ICD-10-CM | POA: Diagnosis not present

## 2024-07-05 DIAGNOSIS — M5412 Radiculopathy, cervical region: Secondary | ICD-10-CM | POA: Diagnosis not present

## 2024-07-05 NOTE — Telephone Encounter (Signed)
 Noted pt. aware

## 2024-07-05 NOTE — Telephone Encounter (Signed)
 Pharmacy Patient Advocate Encounter   Received notification from CoverMyMeds that prior authorization for Voquezna  10MG  tablets is required/requested.   Insurance verification completed.   The patient is insured through [employer].   Prior Authorization for Voquezna  10MG  tablets has been APPROVED from 07-05-2024 to 07-05-2025   PA #/Case ID/Reference #: [id number]

## 2024-07-07 NOTE — Progress Notes (Unsigned)
 Jill Woodard - 23 y.o. female MRN [id number]  Date of birth: 2001/08/14  Office Visit Note: Visit Date: 07/08/2024 PCP: Alvia Selinda PARAS, MD Referred by: Alvia Selinda PARAS, MD  Subjective: No chief complaint on file.  HPI: Jill Woodard is a pleasant 23 y.o. female who presents today for right hand numbness and tingling of the radial sided digits has been present now for multiple years, worsening in nature.  She is describing ongoing nocturnal symptoms as well which are no longer responding to night bracing.  She has trialed utilization of a wrist brace throughout the course of the day as well with nonsteroidal anti-inflammatories from her PCP.  Has not gotten relief of her symptoms with these conservative measures.  Pertinent ROS were reviewed with the patient and found to be negative unless otherwise specified above in HPI.   Visit Reason: right hand numbness and ting Duration of symptoms: 2 years Hand dominance: right Occupation: Research Scientist (medical) Receptionist Diabetic: No Smoking: No Heart/Lung History: SVT Blood Thinners: none  Prior Testing/EMG: none Injections (Date): none Treatments: brace, meloxicam  Prior Surgery: none    Assessment & Plan: Visit Diagnoses:  1. Pain in right wrist   2. Numbness and tingling in right hand     Plan: Extensive discussion was had the patient today regarding her right upper extremity complaints.  She has signs and symptoms consistent with ongoing right-sided carpal tunnel syndrome.  We discussed the underlying etiology and pathophysiology of this condition.  We discussed treatment modalities ranging from conservative to surgical.  From a conservative standpoint, we discussed ongoing bracing, activity modification, nonsteroidal anti-inflammatory with topical and oral, therapeutic exercise and cortisone injections.  From a surgical standpoint we discussed the possibility of carpal tunnel release should symptoms continue to remain  refractory to conservative care.  Given her progressive symptoms, I would like her to obtain a right upper extremity electrodiagnostic study in order to better delineate site and severity of her ongoing condition.  She will return to me after this is complete to discuss appropriate next treatment steps.  For the time being, she can continue utilizing the wrist brace as needed.  Follow-up: No follow-ups on file.   Meds & Orders: No orders of the defined types were placed in this encounter.   Orders Placed This Encounter  Procedures   Ambulatory referral to Physical Medicine Rehab     Procedures: No procedures performed      Clinical History: No specialty comments available.  She reports that she has never smoked. She has never been exposed to tobacco smoke. She has never used smokeless tobacco.  Recent Labs    05/28/24 0938  HGBA1C 5.1    Objective:   Vital Signs: There were no vitals taken for this visit.  Physical Exam  Gen: Well-appearing, in no acute distress; non-toxic CV: Regular Rate. Well-perfused. Warm.  Resp: Breathing unlabored on room air; no wheezing. Psych: Fluid speech in conversation; appropriate affect; normal thought process  Ortho Exam PHYSICAL EXAM:  General: Patient is well appearing and in no distress.   Skin and Muscle: No significant skin changes are apparent to upper extremities.   Range of Motion and Palpation Tests: Mobility is full about the elbows with flexion and extension. Forearm supination and pronation are 85/85 bilaterally.  Wrist flexion/extension is 75/65 bilaterally.  Digital flexion and extension are full.  Thumb opposition is full to the base of the small fingers bilaterally.    No cords or nodules are palpated.  No triggering is observed.    Neurologic, Vascular, Motor: Sensation is diminished to light touch in the right median nerve distribution.    Thenar atrophy: Negative Tinel sign: Positive right carpal tunnel Carpal  tunnel compression: Positive right Phalen test: Positive right  Sensory right hand 2-point discrimination (thumb, index, middle): 7-8 mm  Motor right hand FPL: 5/5 Index FDP: 5/5 APB: 5/5 Thumb opposition to small finger DPC  Fingers pink and well perfused.  Capillary refill is brisk.     Lab Results  Component Value Date   HGBA1C 5.1 05/28/2024      Imaging: No results found.  Past Medical/Family/Surgical/Social History: Medications & Allergies reviewed per EMR, new medications updated. Patient Active Problem List   Diagnosis Date Noted   Carpal tunnel syndrome on right 05/08/2024   Encounter for management and injection of depo-Provera  03/28/2024   Obesity due to excess calories with serious comorbidity 02/28/2024   Constipation 02/28/2024   Nasopharyngitis acute 12/13/2023   Oral ulceration 11/22/2023   Gastroesophageal reflux disease 11/22/2023   Other insomnia 07/20/2023   Skin lesion 07/20/2023   Asthma 04/19/2023   Tachycardia 04/19/2023   Healthcare maintenance 04/19/2023   Thyroid nodule 04/19/2023   Cervical radicular pain 02/14/2023   Depression with anxiety 01/03/2023   Right wrist tendinitis 01/03/2023   Focal seizures (HCC) 10/19/2022   Hx of migraines 12/07/2021   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   Seizure disorder (HCC) 06/29/2015   Past Medical History:  Diagnosis Date   Allergy    tape, wound dressing, tegaderm, sun sensitive   Anxiety    Asthma    Carpal tunnel syndrome 05/22/2024   Depression with anxiety 01/03/2023   Epilepsy (HCC)    Gastroesophageal reflux disease 11/22/2023   Healthcare maintenance 04/19/2023   Heart murmur 04/2023   SVT   Herpes    Oral ulceration 11/22/2023   Seizures (HCC) 2005   last 03/2023   Thyromegaly 04/19/2023   Unilateral partial vocal cord paralysis 2016   Partial left-sided vocal cord paralysis since 2016, possibly secondary   Family History   Problem Relation Age of Onset   Anxiety disorder Mother    Arthritis Mother    Cancer Mother    Obesity Mother    Varicose Veins Mother    Ovarian cysts Mother    Alcohol abuse Father    Drug abuse Father    Depression Sister    ADD / ADHD Sister    Anxiety disorder Sister    Vision loss Sister    Supraventricular tachycardia Sister    Arthritis Maternal Grandmother    Asthma Maternal Grandmother    Diabetes Maternal Grandmother    Hearing loss Maternal Grandmother    Obesity Maternal Grandmother    Cancer Maternal Grandfather    Varicose Veins Maternal Grandfather    Cancer Paternal Grandfather    Colon cancer Neg Hx    Esophageal cancer Neg Hx    Rectal cancer Neg Hx    Stomach cancer Neg Hx    Past Surgical History:  Procedure Laterality Date   ADENOIDECTOMY     FINGER FRACTURE SURGERY     TYMPANOSTOMY TUBE PLACEMENT     VAGUS NERVE STIMULATOR GENERATOR CHANGE  04/16/2023   Battery change only   VAGUS NERVE STIMULATOR INSERTION  07/16/2015   Social History   Occupational History   Occupation: receptionist  Tobacco Use   Smoking status: Never    Passive exposure: Never  Smokeless tobacco: Never  Vaping Use   Vaping status: Never Used  Substance and Sexual Activity   Alcohol use: Never   Drug use: Never   Sexual activity: Yes    Partners: Male    Birth control/protection: Implant    Comment: Nexplanon  06/12/24    Brettany Sydney Estela) Arlinda, M.D. [facility], Hand Surgery

## 2024-07-08 ENCOUNTER — Ambulatory Visit: Admitting: Orthopedic Surgery

## 2024-07-08 DIAGNOSIS — M25531 Pain in right wrist: Secondary | ICD-10-CM | POA: Diagnosis not present

## 2024-07-08 DIAGNOSIS — R200 Anesthesia of skin: Secondary | ICD-10-CM | POA: Diagnosis not present

## 2024-07-08 DIAGNOSIS — R202 Paresthesia of skin: Secondary | ICD-10-CM

## 2024-07-09 ENCOUNTER — Telehealth: Payer: Self-pay | Admitting: Physical Medicine and Rehabilitation

## 2024-07-09 NOTE — Telephone Encounter (Signed)
 Patient called and said she needs to reschedule her appt for the 12/17 to some time in January. CB# [phone]

## 2024-07-17 DIAGNOSIS — M9903 Segmental and somatic dysfunction of lumbar region: Secondary | ICD-10-CM | POA: Diagnosis not present

## 2024-07-17 DIAGNOSIS — M5412 Radiculopathy, cervical region: Secondary | ICD-10-CM | POA: Diagnosis not present

## 2024-07-17 DIAGNOSIS — M9902 Segmental and somatic dysfunction of thoracic region: Secondary | ICD-10-CM | POA: Diagnosis not present

## 2024-07-17 DIAGNOSIS — G4733 Obstructive sleep apnea (adult) (pediatric): Secondary | ICD-10-CM | POA: Diagnosis not present

## 2024-07-17 DIAGNOSIS — M5418 Radiculopathy, sacral and sacrococcygeal region: Secondary | ICD-10-CM | POA: Diagnosis not present

## 2024-07-17 DIAGNOSIS — M546 Pain in thoracic spine: Secondary | ICD-10-CM | POA: Diagnosis not present

## 2024-07-17 DIAGNOSIS — M9901 Segmental and somatic dysfunction of cervical region: Secondary | ICD-10-CM | POA: Diagnosis not present

## 2024-07-17 DIAGNOSIS — M62830 Muscle spasm of back: Secondary | ICD-10-CM | POA: Diagnosis not present

## 2024-07-17 DIAGNOSIS — M41127 Adolescent idiopathic scoliosis, lumbosacral region: Secondary | ICD-10-CM | POA: Diagnosis not present

## 2024-07-17 DIAGNOSIS — M545 Low back pain, unspecified: Secondary | ICD-10-CM | POA: Diagnosis not present

## 2024-07-17 DIAGNOSIS — M9904 Segmental and somatic dysfunction of sacral region: Secondary | ICD-10-CM | POA: Diagnosis not present

## 2024-07-17 DIAGNOSIS — R519 Headache, unspecified: Secondary | ICD-10-CM | POA: Diagnosis not present

## 2024-07-24 DIAGNOSIS — M9901 Segmental and somatic dysfunction of cervical region: Secondary | ICD-10-CM | POA: Diagnosis not present

## 2024-07-24 DIAGNOSIS — M546 Pain in thoracic spine: Secondary | ICD-10-CM | POA: Diagnosis not present

## 2024-07-24 DIAGNOSIS — M41127 Adolescent idiopathic scoliosis, lumbosacral region: Secondary | ICD-10-CM | POA: Diagnosis not present

## 2024-07-24 DIAGNOSIS — M9903 Segmental and somatic dysfunction of lumbar region: Secondary | ICD-10-CM | POA: Diagnosis not present

## 2024-07-24 DIAGNOSIS — M5412 Radiculopathy, cervical region: Secondary | ICD-10-CM | POA: Diagnosis not present

## 2024-07-24 DIAGNOSIS — R519 Headache, unspecified: Secondary | ICD-10-CM | POA: Diagnosis not present

## 2024-07-24 DIAGNOSIS — M9904 Segmental and somatic dysfunction of sacral region: Secondary | ICD-10-CM | POA: Diagnosis not present

## 2024-07-24 DIAGNOSIS — M62830 Muscle spasm of back: Secondary | ICD-10-CM | POA: Diagnosis not present

## 2024-07-24 DIAGNOSIS — M9902 Segmental and somatic dysfunction of thoracic region: Secondary | ICD-10-CM | POA: Diagnosis not present

## 2024-07-24 DIAGNOSIS — M545 Low back pain, unspecified: Secondary | ICD-10-CM | POA: Diagnosis not present

## 2024-07-24 DIAGNOSIS — M5418 Radiculopathy, sacral and sacrococcygeal region: Secondary | ICD-10-CM | POA: Diagnosis not present

## 2024-07-26 DIAGNOSIS — M9902 Segmental and somatic dysfunction of thoracic region: Secondary | ICD-10-CM | POA: Diagnosis not present

## 2024-07-26 DIAGNOSIS — M546 Pain in thoracic spine: Secondary | ICD-10-CM | POA: Diagnosis not present

## 2024-07-26 DIAGNOSIS — M545 Low back pain, unspecified: Secondary | ICD-10-CM | POA: Diagnosis not present

## 2024-07-26 DIAGNOSIS — M9903 Segmental and somatic dysfunction of lumbar region: Secondary | ICD-10-CM | POA: Diagnosis not present

## 2024-07-26 DIAGNOSIS — M5412 Radiculopathy, cervical region: Secondary | ICD-10-CM | POA: Diagnosis not present

## 2024-07-26 DIAGNOSIS — M9904 Segmental and somatic dysfunction of sacral region: Secondary | ICD-10-CM | POA: Diagnosis not present

## 2024-07-26 DIAGNOSIS — M62830 Muscle spasm of back: Secondary | ICD-10-CM | POA: Diagnosis not present

## 2024-07-26 DIAGNOSIS — R519 Headache, unspecified: Secondary | ICD-10-CM | POA: Diagnosis not present

## 2024-07-26 DIAGNOSIS — M5418 Radiculopathy, sacral and sacrococcygeal region: Secondary | ICD-10-CM | POA: Diagnosis not present

## 2024-07-26 DIAGNOSIS — M41127 Adolescent idiopathic scoliosis, lumbosacral region: Secondary | ICD-10-CM | POA: Diagnosis not present

## 2024-07-26 DIAGNOSIS — M9901 Segmental and somatic dysfunction of cervical region: Secondary | ICD-10-CM | POA: Diagnosis not present

## 2024-07-28 ENCOUNTER — Encounter: Payer: Self-pay | Admitting: Sleep Medicine

## 2024-07-29 ENCOUNTER — Encounter: Payer: Self-pay | Admitting: Cardiology

## 2024-07-31 ENCOUNTER — Encounter: Admitting: Physical Medicine and Rehabilitation

## 2024-07-31 ENCOUNTER — Other Ambulatory Visit: Payer: Self-pay

## 2024-07-31 MED ORDER — METOPROLOL SUCCINATE ER 50 MG PO TB24: 50.0000 mg | ORAL_TABLET | Freq: Every day | ORAL | 3 refills | Status: DC

## 2024-08-13 ENCOUNTER — Encounter: Payer: Self-pay | Admitting: Orthopedic Surgery

## 2024-08-20 ENCOUNTER — Encounter: Admitting: Physical Medicine and Rehabilitation

## 2024-08-26 ENCOUNTER — Ambulatory Visit: Payer: Self-pay | Admitting: Sleep Medicine

## 2024-08-26 ENCOUNTER — Encounter: Payer: Self-pay | Admitting: Family Medicine

## 2024-08-26 NOTE — Telephone Encounter (Signed)
 Please review and advise patient. I did go ahead and get her scheduled for Wednesday.  JM

## 2024-08-28 ENCOUNTER — Encounter: Payer: Self-pay | Admitting: Family Medicine

## 2024-08-28 ENCOUNTER — Ambulatory Visit: Admitting: Family Medicine

## 2024-08-28 ENCOUNTER — Ambulatory Visit: Admitting: Cardiology

## 2024-08-28 VITALS — BP 100/78 | HR 95 | Ht 67.0 in | Wt 175.0 lb

## 2024-08-28 DIAGNOSIS — R600 Localized edema: Secondary | ICD-10-CM | POA: Diagnosis not present

## 2024-08-28 DIAGNOSIS — M2550 Pain in unspecified joint: Secondary | ICD-10-CM

## 2024-08-28 DIAGNOSIS — E041 Nontoxic single thyroid nodule: Secondary | ICD-10-CM | POA: Diagnosis not present

## 2024-08-28 MED ORDER — FUROSEMIDE 20 MG PO TABS: 10.0000 mg | ORAL_TABLET | Freq: Every day | ORAL | 0 refills | Status: AC | PRN

## 2024-08-28 NOTE — Patient Instructions (Signed)
" °  VISIT SUMMARY: During your visit, we discussed the acute swelling in your hands, face, and feet, as well as your known conditions of carpal tunnel syndrome, polyarthralgia, and a thyroid nodule. We have made some adjustments to your medications and ordered several tests to better understand your symptoms.  YOUR PLAN: POLYARTHRALGIA WITH LOCALIZED EDEMA: You have swelling in your hands, face, and feet, which may be due to inflammation, diet, or medication side effects. -Restart Celebrex  200 mg twice daily with food. -If swelling persists, take furosemide  as prescribed. -We have ordered an autoimmune panel, ESR, CRP, and a metabolic panel to investigate further. -Follow a low-sodium diet as discussed. -We may refer you to a rheumatologist if your symptoms persist or if the tests show any abnormalities.  THYROID NODULE: Your thyroid nodule may be contributing to the swelling. -We have ordered thyroid function tests. -Your follow-up imaging or biopsy is scheduled for February.                      Contains text generated by Abridge.                                 Contains text generated by Abridge.   "

## 2024-08-28 NOTE — Assessment & Plan Note (Signed)
" °  Orders:   TSH+T4F+T3Free  "

## 2024-08-28 NOTE — Progress Notes (Signed)
 "    Primary Care / Sports Medicine Office Visit  Patient Information:  Patient ID: Jill Woodard, female DOB: [birth year] Age: 24 y.o. MRN: [id number]   Jill Woodard is a pleasant 24 y.o. female presenting with the following:  Chief Complaint  Patient presents with   Edema    Facial swelling and bil hand swelling since 09/16/2024.     Vitals:   08/28/24 0937  BP: 100/78  Pulse: 95  SpO2: 98%   Vitals:   08/28/24 0937  Weight: 175 lb (79.4 kg)  Height: 5' 7 (1.702 m)   Body mass index is 27.41 kg/m.  No results found.   Independent interpretation of notes and tests performed by another provider:   None  Procedures performed:   None  Pertinent History, Exam, Impression, and Recommendations:   Discussed the use of AI scribe software for clinical note transcription with the patient, who gave verbal consent to proceed.  History of Present Illness   Jill Woodard is a 24 year old female with carpal tunnel syndrome who presents with acute onset swelling of the hands, face, and feet.  Edema: - Acute onset bilateral hand swelling since 2024/09/16, more pronounced than baseline right finger edema - Swelling required removal of rings due to discomfort - Persistent swelling without improvement - New facial swelling began August 26, 2024, with uncertain resolution - Bilateral pedal edema developed by August 27, 2024, requiring loosening of shoelaces due to increased swelling compared to the previous day - No increased sodium intake; consumed cookies, brownies, and tacos over the weekend - No other significant dietary changes - No dyspnea or other respiratory symptoms associated with edema - Chest tightness when lying supine  Polyarthralgia and Carpal Tunnel Syndrome: - Known right hand carpal tunnel syndrome, awaiting nerve conduction study (delayed) - Orthopedics has discussed possible tendon release surgery pending further evaluation - No  recent use of Celebrex  over the weekend; uses only as needed - No recent corticosteroid therapy  Otolgia: - Intermittent left ear pain since August 21, 2024 - Aggravated by cold exposure, Q-tip use, or water exposure - Some improvement in symptoms  Thyroid  Nodule: - Known thyroid  nodule, last evaluated August 2025 - Plans for repeat imaging or biopsy in February 2026 - Aware of potential relevance of thyroid  dysfunction to current symptoms     Physical Exam HEENT: Ears normal, tympanic membranes intact. EXTREMITIES: No pitting edema bilaterally.  Assessment and Plan    Polyarthralgia with localized edema Non-pitting edema with polyarthralgia suggests inflammatory etiology, dietary sodium retention, or less likely, medication side effects. - Restarted Celebrex  200 mg twice daily with food. - Prescribed furosemide  as backup if Celebrex  is insufficient or swelling persists. - Ordered autoimmune panel, ESR, and CRP. - Ordered metabolic panel. - Provided dietary counseling on sodium intake. - Discussed possible rheumatology referral if autoimmune markers are abnormal or symptoms persist.  Thyroid  nodule Thyroid  dysfunction may contribute to generalized edema. - Ordered thyroid  function tests. - Confirmed follow-up imaging or biopsy scheduled for February.        Assessment & Plan Polyarthralgia  Orders:   ANA 12Plus Profile, Do All RDL   TSH+T4F+T3Free   Comprehensive Metabolic Panel (CMET)   Sed Rate (ESR)   C-reactive protein  Localized edema  Orders:   ANA 12Plus Profile, Do All RDL   Comprehensive Metabolic Panel (CMET)   Sed Rate (ESR)   C-reactive protein   furosemide  (LASIX ) 20 MG tablet; Take 0.5 tablets (10  mg total) by mouth daily as needed. Limit to no more than 1-2 doses per week  Thyroid nodule  Orders:   TSH+T4F+T3Free   No follow-ups on file.     Selinda JINNY Ku, MD, [facility]   Primary Care Sports Medicine Primary Care and Sports Medicine at  [facility]   "

## 2024-08-29 ENCOUNTER — Encounter: Payer: Self-pay | Admitting: Orthopedic Surgery

## 2024-09-03 ENCOUNTER — Encounter: Payer: Self-pay | Admitting: Family Medicine

## 2024-09-03 NOTE — Telephone Encounter (Signed)
 Please review and advise. JM

## 2024-09-04 LAB — TSH+T4F+T3FREE
Free T4: 1.26 ng/dL (ref 0.82–1.77)
T3, Free: 3.9 pg/mL (ref 2.0–4.4)
TSH: 1.39 u[IU]/mL (ref 0.450–4.500)

## 2024-09-04 LAB — SEDIMENTATION RATE: Sed Rate: 5 mm/h (ref 0–32)

## 2024-09-04 LAB — COMPREHENSIVE METABOLIC PANEL WITH GFR
ALT: 20 IU/L (ref 0–32)
AST: 22 IU/L (ref 0–40)
Albumin: 4.8 g/dL (ref 4.0–5.0)
Alkaline Phosphatase: 91 IU/L (ref 41–116)
BUN/Creatinine Ratio: 11 (ref 9–23)
BUN: 9 mg/dL (ref 6–20)
Bilirubin Total: 0.6 mg/dL (ref 0.0–1.2)
CO2: 19 mmol/L — ABNORMAL LOW (ref 20–29)
Calcium: 9.7 mg/dL (ref 8.7–10.2)
Chloride: 102 mmol/L (ref 96–106)
Creatinine, Ser: 0.79 mg/dL (ref 0.57–1.00)
Globulin, Total: 3.5 g/dL (ref 1.5–4.5)
Glucose: 84 mg/dL (ref 70–99)
Potassium: 4.3 mmol/L (ref 3.5–5.2)
Sodium: 138 mmol/L (ref 134–144)
Total Protein: 8.3 g/dL (ref 6.0–8.5)
eGFR: 108 mL/min/1.73

## 2024-09-04 LAB — ANA 12PLUS PROFILE, DO ALL RDL
Anti-CCP Ab, IgG & IgA (RDL): <20 U
Anti-Cardiolipin Ab, IgA (RDL): <12 U/mL
Anti-Cardiolipin Ab, IgG (RDL): <15 GPL U/mL
Anti-Cardiolipin Ab, IgM (RDL): 26 [MPL'U]/mL — ABNORMAL HIGH
Anti-Centromere Ab (RDL): <1:40 {titer}
Anti-Chromatin Ab, IgG (RDL): <20 U
Anti-La (SS-B) Ab (RDL): <20 U
Anti-Nuclear Ab by IFA (RDL): NEGATIVE
Anti-Ro (SS-A) Ab (RDL): <20 U
Anti-Scl-70 Ab (RDL): <20 U
Anti-Sm Ab (RDL): <20 U
Anti-TPO Ab (RDL): <9 [IU]/mL
Anti-U1 RNP Ab (RDL): <20 U
Anti-dsDNA Ab by Farr(RDL): <8 [IU]/mL
C3 Complement (RDL): 152 mg/dL (ref 90–180)
C4 Complement (RDL): 27 mg/dL (ref 10–40)
Rheumatoid Factor by Turb RDL: <14 [IU]/mL

## 2024-09-04 LAB — C-REACTIVE PROTEIN: CRP: 2 mg/L (ref 0–10)

## 2024-09-10 ENCOUNTER — Telehealth: Payer: Self-pay

## 2024-09-10 NOTE — Telephone Encounter (Signed)
 Copied from CRM #[id number]. Topic: Appointments - Transfer of Care >> Sep 10, 2024  2:20 PM Joesph B wrote: Pt is requesting to transfer FROM: Dr Alvia Pt is requesting to transfer TO: Darice petty Reason for requested transfer: pcp leaving  It is the responsibility of the team the patient would like to transfer to (Dr. petty) to reach out to the patient if for any reason this transfer is not acceptable.

## 2024-09-18 ENCOUNTER — Encounter: Payer: Self-pay | Admitting: Cardiology

## 2024-09-18 ENCOUNTER — Ambulatory Visit: Admitting: Cardiology

## 2024-09-18 VITALS — BP 100/80 | HR 103 | Ht 67.0 in | Wt 170.1 lb

## 2024-09-18 DIAGNOSIS — I4711 Inappropriate sinus tachycardia, so stated: Secondary | ICD-10-CM | POA: Diagnosis not present

## 2024-09-18 MED ORDER — METOPROLOL SUCCINATE ER 50 MG PO TB24: 50.0000 mg | ORAL_TABLET | Freq: Every day | ORAL | 3 refills | Status: AC

## 2024-09-18 MED ORDER — METOPROLOL SUCCINATE ER 25 MG PO TB24: 12.5000 mg | ORAL_TABLET | Freq: Every day | ORAL | Status: AC

## 2024-09-18 NOTE — Patient Instructions (Signed)
 Medication Instructions:  Your physician recommends the following medication changes.  START TAKING: Metoprolol  12.5 mg in addition to 50 mg, for a total of 62.5 mg by mouth daily.   *If you need a refill on your cardiac medications before your next appointment, please call your pharmacy*  Lab Work: No labs ordered today    Testing/Procedures: Your physician has requested that you have an echocardiogram. Echocardiography is a painless test that uses sound waves to create images of your heart. It provides your doctor with information about the size and shape of your heart and how well your hearts chambers and valves are working.   You may receive an ultrasound enhancing agent through an IV if needed to better visualize your heart during the echo. This procedure takes approximately one hour.  There are no restrictions for this procedure.  This will take place at [address] [facility] [address], [place] [address]  Please note: We ask at that you not bring children with you during ultrasound (echo/ vascular) testing. Due to room size and safety concerns, children are not allowed in the ultrasound rooms during exams. Our front office staff cannot provide observation of children in our lobby area while testing is being conducted. An adult accompanying a patient to their appointment will only be allowed in the ultrasound room at the discretion of the ultrasound technician under special circumstances. We apologize for any inconvenience.   Follow-Up: At [facility], you and your health needs are our priority.  As part of our continuing mission to provide you with exceptional heart care, our providers are all part of one team.  This team includes your primary Cardiologist (physician) and Advanced Practice Providers or APPs (Physician Assistants and Nurse Practitioners) who all work together to provide you with the care you need, when you need it.  Your next appointment:    6 week(s)  Provider:   You may see Redell Cave, MD or one of the following Advanced Practice Providers on your designated Care Team:   Tylene Lunch, NP

## 2024-09-25 ENCOUNTER — Encounter: Admitting: Physical Medicine and Rehabilitation

## 2024-10-02 ENCOUNTER — Ambulatory Visit

## 2024-10-30 ENCOUNTER — Ambulatory Visit: Admitting: Cardiology

## 2025-01-29 ENCOUNTER — Encounter: Admitting: Nurse Practitioner

## 2025-05-29 ENCOUNTER — Encounter: Admitting: Family Medicine
